# Patient Record
Sex: Male | Born: 1970 | Race: White | Hispanic: No | Marital: Married | Smoking: Never smoker
Health system: Southern US, Community
[De-identification: ages and names within clinical notes are randomized; demographics above are authoritative.]

## PROBLEM LIST (undated history)

## (undated) DIAGNOSIS — F329 Major depressive disorder, single episode, unspecified: Secondary | ICD-10-CM

## (undated) DIAGNOSIS — K219 Gastro-esophageal reflux disease without esophagitis: Secondary | ICD-10-CM

## (undated) DIAGNOSIS — F32A Depression, unspecified: Secondary | ICD-10-CM

## (undated) DIAGNOSIS — I10 Essential (primary) hypertension: Secondary | ICD-10-CM

## (undated) DIAGNOSIS — F419 Anxiety disorder, unspecified: Secondary | ICD-10-CM

## (undated) DIAGNOSIS — I4510 Unspecified right bundle-branch block: Secondary | ICD-10-CM

## (undated) DIAGNOSIS — E785 Hyperlipidemia, unspecified: Secondary | ICD-10-CM

## (undated) DIAGNOSIS — F909 Attention-deficit hyperactivity disorder, unspecified type: Secondary | ICD-10-CM

## (undated) HISTORY — DX: Major depressive disorder, single episode, unspecified: F32.9

## (undated) HISTORY — DX: Essential (primary) hypertension: I10

## (undated) HISTORY — DX: Anxiety disorder, unspecified: F41.9

## (undated) HISTORY — DX: Hyperlipidemia, unspecified: E78.5

## (undated) HISTORY — DX: Depression, unspecified: F32.A

## (undated) HISTORY — DX: Gastro-esophageal reflux disease without esophagitis: K21.9

## (undated) HISTORY — DX: Unspecified right bundle-branch block: I45.10

## (undated) HISTORY — DX: Attention-deficit hyperactivity disorder, unspecified type: F90.9

---

## 2008-10-22 HISTORY — PX: ESOPHAGOGASTRODUODENOSCOPY: SHX1529

## 2009-10-22 HISTORY — PX: COLONOSCOPY: SHX174

## 2010-11-25 ENCOUNTER — Emergency Department (HOSPITAL_COMMUNITY)
Admission: EM | Admit: 2010-11-25 | Discharge: 2010-11-25 | Disposition: A | Discharge status: Home / Self Care | Payer: 59 | Attending: Emergency Medicine | Admitting: Emergency Medicine

## 2010-11-25 DIAGNOSIS — I10 Essential (primary) hypertension: Secondary | ICD-10-CM | POA: Insufficient documentation

## 2010-11-25 DIAGNOSIS — E7800 Pure hypercholesterolemia, unspecified: Secondary | ICD-10-CM | POA: Insufficient documentation

## 2010-11-25 DIAGNOSIS — K219 Gastro-esophageal reflux disease without esophagitis: Secondary | ICD-10-CM | POA: Insufficient documentation

## 2010-11-25 DIAGNOSIS — R42 Dizziness and giddiness: Secondary | ICD-10-CM | POA: Insufficient documentation

## 2011-01-03 ENCOUNTER — Encounter: Payer: Self-pay | Admitting: Physician Assistant

## 2011-01-03 ENCOUNTER — Ambulatory Visit (INDEPENDENT_AMBULATORY_CARE_PROVIDER_SITE_OTHER): Payer: 59 | Admitting: Physician Assistant

## 2011-01-03 DIAGNOSIS — E785 Hyperlipidemia, unspecified: Secondary | ICD-10-CM | POA: Insufficient documentation

## 2011-01-03 DIAGNOSIS — F411 Generalized anxiety disorder: Secondary | ICD-10-CM | POA: Insufficient documentation

## 2011-01-03 DIAGNOSIS — R002 Palpitations: Secondary | ICD-10-CM | POA: Insufficient documentation

## 2011-01-03 DIAGNOSIS — I10 Essential (primary) hypertension: Secondary | ICD-10-CM | POA: Insufficient documentation

## 2011-01-03 DIAGNOSIS — R079 Chest pain, unspecified: Secondary | ICD-10-CM | POA: Insufficient documentation

## 2011-01-03 NOTE — Progress Notes (Deleted)
Subjective:      Patient ID: Eddie Cook is a 40 y.o. male.  Chief Complaint: HPI {Common ambulatory SmartLinks:19316} ROS    Objective:    Physical Exam  Lab Review:  {Recent labs:19471::"not applicable"}    Assessment:     No diagnosis found.   Plan:     ***

## 2011-01-03 NOTE — Progress Notes (Unsigned)
HPI:  This is a 40 year old white male patient who is referred to [place] for evaluation of left arm pain. He complains of left arm hurting that comes and goes and has been going on for several months. He notices it more at rest. It is not associated with any chest pain, palpitations, dizziness, or presyncope. He occasionally has a sharp shooting chest pain while at rest but this is fleeting as well. He occasionally notices pounding of his heart but this is not associated with the left arm pain.   He can run 5-7 miles at a time and has no symptoms while he runs. He is under an enormous amount of  stress as a [employer] and claims that he is ADD and has a lot of anxiety issues. He drinks 2-3, 12 ounce diet Cokes daily and feels like he is wound tight.. He denies any chest pressure, heaviness, radiation into his neck back or arm. He did go to an urgent care in [place] month ago for dizziness after a long weekend of meetings and he was told his EKG had a slight right bundle branch block but he knew he had that. He also had a stress Myoview in 2008  in [place] when he presented with chest pain after drinking 3 large energy drinks. He was told he had a right bundle branch block at that time and the stress test was normal.  Allergies not on file    PMHX: hypertension, hyperlipidemia, low testosterone, EDD, GERD, incomplete right bundle branch block,  No past surgical history on file.  No family history on file.  History   Social History  . Marital Status: Divorced    Spouse Name: N/A    Number of Children: N/A  . Years of Education: N/A   Occupational History  . Not on file.   Social History Main Topics  . Smoking status: Not on file  . Smokeless tobacco: Not on file  . Alcohol Use: Not on file  . Drug Use: Not on file  . Sexually Active: Not on file   Other Topics Concern  . Not on file   Social History Narrative  . No narrative on file    ROS{Ros -  complete:30496} The patient denies anorexia, fever, weight loss,, vision loss, decreased hearing, hoarseness, chest pain, syncope, dyspnea on exertion, peripheral edema, balance deficits, hemoptysis, abdominal pain, melena, hematochezia, severe indigestion/heartburn, hematuria, incontinence, genital sores, muscle weakness, suspicious skin lesions, transient blindness, difficulty walking, depression, unusual weight change, abnormal bleeding, enlarged lymph nodes, angioedema, and breast masses.  PHYSICAL EXAM Well-nournished, in no acute distress. Neck: No JVD, HJR, Bruit, or thyroid enlargement Lungs: No tachypnea, clear without wheezing, rales, or rhonchi Cardiovascular: RRR, PMI not displaced, heart sounds normal, no murmurs, gallops, bruit, thrill, or heave. Abdomen: BS normal. Soft without organomegaly, masses, lesions or tenderness. Extremities: without cyanosis, clubbing or edema. Good distal pulses bilateral SKin: Warm, no lesions or rashes  Musculoskeletal: No deformities Neuro: no focal signs  There were no vitals taken for this visit.  EKG:  ASSESSMENT AND PLAN:

## 2011-01-09 NOTE — Assessment & Plan Note (Signed)
Summary: NP CP PER DR WALL/TMJ   Visit Type:  Initial Consult Primary Provider:  Dr.Hawkins   History of Present Illness: This is a 40 year old white male patient who is referred to [place] for evaluation of left arm pain. He complains of left arm hurting that comes and goes and has been going on for several months. He notices it more at rest. It is not associated with any chest pain, palpitations, dizziness, or presyncope. He occasionally has a sharp shooting chest pain while at rest but this is fleeting as well. He occasionally notices pounding of his heart but this is not associated with the left arm pain.   He can run 5-7 miles at a time and has no symptoms while he runs. He is under an enormous amount of  stress as a [place] [employer] and claims that he is ADD and has a lot of anxiety issues. He drinks 2-3, 12 ounce diet Cokes daily and feels like he is wound tight.. He denies any chest pressure, heaviness, radiation into his neck back or arm. He did go to an urgent care in [place] month ago for dizziness after a long weekend of meetings and he was told his EKG had a slight right bundle branch block but he knew he had that. He also had a stress Myoview in 2008  in [place] when he presented with chest pain after drinking 3 large energy drinks. He was told he had a right bundle branch block at that time and the stress test was normal.  Current Medications (verified): 1)  Omeprazole 20 Mg Cpdr (Omeprazole) .... Take 1 Tab Daily 2)  Losartan Potassium-Hctz 50-12.5 Mg Tabs (Losartan Potassium-Hctz) .... Take 1 Tab Daily 3)  Pravachol 40 Mg Tabs (Pravastatin Sodium) .... Take 1 Tab Daily 4)  Pristiq 100 Mg Xr24h-Tab (Desvenlafaxine Succinate) .... Take 1 Tab Daily 5)  Concerta 36 Mg Cr-Tabs (Methylphenidate Hcl) .... Take 1 Tab Daily 6)  Androgel 25 Mg/2.5gm Gel (Testosterone) .... Use As Directed  Allergies (verified): 1)  ! Penicillin  Past History:  Past Medical  History: hypertension, hyperlipidemia, low testosterone, EDD, GERD, incomplete right bundle branch block,  Family History: Reviewed history and no changes required. Father:that of a blood clot in his leg age 79 Mother:73 with hypertension and dementia Siblings:one brother with hypertension one sister alive and well maternal grandfather died of an MI maternal grandmother died of heart disease  Social History: Reviewed history and no changes required. [facility] Divorced with 2 children He occasionally smokes tobaccoless cigarettes or cigars when he golfs but has never been a tobacco smoker Runs 5-7 miles several days a week Drinks 3-4 alcoholic drinks socially per week  Review of Systems       patient has a significant amount of stress and anxiety related to his job,otherwise review of systems are negative  Vital Signs:  Patient profile:   40 year old male Height:      72 inches Weight:      194 pounds BMI:     26.41 Pulse rate:   63 / minute BP sitting:   148 / 88  (left arm)  Vitals Entered By: Dreama Saa, CNA (January 03, 2011 1:24 PM)  Physical Exam  General:   Well-nournished, in no acute distress. Neck: No JVD, HJR, Bruit, or thyroid enlargement Lungs: No tachypnea, clear without wheezing, rales, or rhonchi Cardiovascular: RRR, PMI not displaced, positive S4, no murmurs, bruit, thrill, or heave. Abdomen: BS normal. Soft without organomegaly,  masses, lesions or tenderness. Extremities: without cyanosis, clubbing or edema. Good distal pulses bilateral SKin: Warm, no lesions or rashes  Musculoskeletal: No deformities Neuro: no focal signs    EKG  Procedure date:  01/03/2011  Findings:      normal sinus rhythm with incomplete right bundle branch block  Impression & Recommendations:  Problem # 1:  CHEST PAIN-UNSPECIFIED (ICD-786.50) Patient has history of sharp shooting chest pain that is not associated with his left arm hurting it has been  going off-and-on for several months. His symptoms do not sound cardiac in nature. He can run 5-7 miles without reproducing his symptoms.Dr. Daleen Squibb has offered him reassurance and we will not do any further cardiac workup at this time.  Problem # 2:  PALPITATIONS (ICD-785.1) Patient has occasional pounding in his chest but no associated dizziness or presyncope or fast heart rates. I asked him to decrease his caffeine intake.  Problem # 3:  HYPERTENSION, BENIGN (ICD-401.1) Patient's blood pressure is controlled for the most part. Blood pressures range at home from 118-138/80-85 His updated medication list for this problem includes:    Losartan Potassium-hctz 50-12.5 Mg Tabs (Losartan potassium-hctz) .Marland Kitchen... Take 1 tab daily  Problem # 4:  HYPERLIPIDEMIA-MIXED (ICD-272.4) treated and followed by Dr. Juanetta Gosling His updated medication list for this problem includes:    Pravachol 40 Mg Tabs (Pravastatin sodium) .Marland Kitchen... Take 1 tab daily  Patient Instructions: 1)  Decrease caffeine intake 2)  Your physician recommends that you schedule a follow-up appointment in: as needed  Appended Document: NP CP PER DR WALL/TMJ pt seen and examined. Education provided regarding sxs of CAD and how to respond.

## 2011-10-12 ENCOUNTER — Encounter: Payer: Self-pay | Admitting: Cardiology

## 2012-09-29 ENCOUNTER — Ambulatory Visit (INDEPENDENT_AMBULATORY_CARE_PROVIDER_SITE_OTHER): Payer: BLUE CROSS/BLUE SHIELD | Admitting: Gastroenterology

## 2012-09-29 ENCOUNTER — Encounter: Payer: Self-pay | Admitting: Gastroenterology

## 2012-09-29 VITALS — BP 150/90 | HR 63 | Temp 98.2°F | Ht 72.0 in | Wt 193.4 lb

## 2012-09-29 DIAGNOSIS — R1314 Dysphagia, pharyngoesophageal phase: Secondary | ICD-10-CM

## 2012-09-29 DIAGNOSIS — R1013 Epigastric pain: Secondary | ICD-10-CM | POA: Insufficient documentation

## 2012-09-29 DIAGNOSIS — R1110 Vomiting, unspecified: Secondary | ICD-10-CM | POA: Insufficient documentation

## 2012-09-29 DIAGNOSIS — R1310 Dysphagia, unspecified: Secondary | ICD-10-CM | POA: Insufficient documentation

## 2012-09-29 LAB — COMPREHENSIVE METABOLIC PANEL
Albumin: 4.8 g/dL (ref 3.5–5.2)
Alkaline Phosphatase: 42 U/L (ref 39–117)
BUN: 14 mg/dL (ref 6–23)
CO2: 27 mEq/L (ref 19–32)
Calcium: 9.8 mg/dL (ref 8.4–10.5)
Glucose, Bld: 106 mg/dL — ABNORMAL HIGH (ref 70–99)
Potassium: 4.3 mEq/L (ref 3.5–5.3)

## 2012-09-29 LAB — CBC WITH DIFFERENTIAL/PLATELET
Basophils Relative: 1 % (ref 0–1)
HCT: 41.9 % (ref 39.0–52.0)
Hemoglobin: 14.5 g/dL (ref 13.0–17.0)
Lymphs Abs: 1.6 10*3/uL (ref 0.7–4.0)
MCH: 31.5 pg (ref 26.0–34.0)
MCHC: 34.6 g/dL (ref 30.0–36.0)
Monocytes Absolute: 0.5 10*3/uL (ref 0.1–1.0)
Monocytes Relative: 8 % (ref 3–12)
Neutro Abs: 3.7 10*3/uL (ref 1.7–7.7)
RBC: 4.61 MIL/uL (ref 4.22–5.81)

## 2012-09-29 LAB — LIPASE: Lipase: 22 U/L (ref 0–75)

## 2012-09-29 NOTE — Assessment & Plan Note (Signed)
Several week h/o epigastric pain associated with intermittent N/V, esophageal dysphagia to solid foods. Patient admits to couple of aspirin powders per week at times. Describes EGD for food impaction a couple of years ago. Report requested. He is unsure if esophagus stretched at that time. He reports waking up during EGD but quickly given more sedation. Discussed conscious sedation with augmentation with phenergan vs propofol. Patient prefers conscious sedation.  EGD/ED in near future. I have discussed the risks, alternatives, benefits with regards to but not limited to the risk of reaction to medication, bleeding, infection, perforation and the patient is agreeable to proceed. Written consent to be obtained.  Labs ordered.

## 2012-09-29 NOTE — Progress Notes (Signed)
Primary Care Physician:  Fredirick Maudlin, MD  Primary Gastroenterologist:  Roetta Sessions, MD   Chief Complaint  Patient presents with  . Abdominal Pain    HPI:  BRYLAN DEC is a 41 y.o. male here for further evaluation of 3-4 week h/o epigastric pain associated with intermittent N/V. States he has been under a lot of stress with family and work. Mother passed away recently with lung cancer/dementia. Over the past three-four weeks he has had increasing pain in LUQ/epigastric pain. He feels weak. Cannot eat well due to N/V. Prilosec helps heartburn. Lots of heartburn if misses dose. Difficulty swallowing, especially meats. In 2011, steak became lodged requiring emergent EGD. He is not sure if esophageus was stretched. Denies constipation, diarrhea, melena, brbpr, weight loss.     Current Outpatient Prescriptions  Medication Sig Dispense Refill  . escitalopram (LEXAPRO) 20 MG tablet Take 10 mg by mouth daily.      Marland Kitchen losartan-hydrochlorothiazide (HYZAAR) 50-12.5 MG per tablet Take 1 tablet by mouth daily.        Marland Kitchen omeprazole (PRILOSEC) 20 MG capsule Take 20 mg by mouth daily.        . pravastatin (PRAVACHOL) 40 MG tablet Take 40 mg by mouth daily.        . Testosterone (ANDROGEL) 25 MG/2.5GM GEL Place onto the skin as directed.          Allergies as of 09/29/2012 - Review Complete 09/29/2012  Allergen Reaction Noted  . Penicillins  01/03/2011    Past Medical History  Diagnosis Date  . Hypertension   . Hyperlipidemia   . Low testosterone   . GERD (gastroesophageal reflux disease)   . Incomplete RBBB   . Depression   . Anxiety     Past Surgical History  Procedure Date  . Colonoscopy     [facility] [facility] about year ago  . Esophagogastroduodenoscopy 2011    foreign body per patient    Family History  Problem Relation Age of Onset  . Hypertension Mother   . Dementia Mother     deceased, cancer-adenocarcinoma lung  . Clotting disorder Father 63    Blood clot in leg  .  Hypertension Brother   . Heart disease Maternal Grandmother   . Heart attack Maternal Grandfather   . Liver disease Neg Hx   . Colon cancer Neg Hx     History   Social History  . Marital Status: Divorced    Spouse Name: N/A    Number of Children: 2  . Years of Education: N/A   Occupational History  . 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 Smoking status: Never Smoker   . Smokeless tobacco: Not on file     Comment: Occasionally smokes tobaccoless cigarettes or cigars when golfing, but never tobacco  . Alcohol Use: 2.0 oz/week    4 drink(s) per week     Comment: Socially, couple of beers on the weekend  . Drug Use: No  . Sexually Active: Not on file   Other Topics Concern  . Not on file   Social History Narrative   Divorced, 2 childrenRuns 5-7 miles several days a week      ROS:  General: Negative for anorexia, weight loss, fever, chills, fatigue, weakness. Eyes: Negative for vision changes.  ENT: Negative for hoarseness, nasal congestion. CV: Negative for chest pain, angina, palpitations, dyspnea on exertion, peripheral edema.  Respiratory: Negative for dyspnea at rest, dyspnea on  exertion, cough, sputum, wheezing.  GI: See history of present illness. GU:  Negative for dysuria, hematuria, urinary incontinence, urinary frequency, nocturnal urination.  MS: Negative for joint pain, low back pain.  Derm: Negative for rash or itching.  Neuro: Negative for weakness, abnormal sensation, seizure, frequent headaches, memory loss, confusion.  Psych: Negative for anxiety, depression, suicidal ideation, hallucinations.  Endo: Negative for unusual weight change.  Heme: Negative for bruising or bleeding. Allergy: Negative for rash or hives.    Physical Examination:  BP 150/90  Pulse 63  Temp 98.2 F (36.8 C) (Oral)  Ht 6' (1.829 m)  Wt 193 lb 6.4 oz (87.726 kg)  BMI 26.23 kg/m2   General: Well-nourished, well-developed in no acute  distress.  Head: Normocephalic, atraumatic.   Eyes: Conjunctiva pink, no icterus. Mouth: Oropharyngeal mucosa moist and pink , no lesions erythema or exudate. Neck: Supple without thyromegaly, masses, or lymphadenopathy.  Lungs: Clear to auscultation bilaterally.  Heart: Regular rate and rhythm, no murmurs rubs or gallops.  Abdomen: Bowel sounds are normal, mild epigastric tenderness, nondistended, no hepatosplenomegaly or masses, no abdominal bruits or    hernia , no rebound or guarding.   Rectal: not performed Extremities: No lower extremity edema. No clubbing or deformities.  Neuro: Alert and oriented x 4 , grossly normal neurologically.  Skin: Warm and dry, no rash or jaundice.   Psych: Alert and cooperative, normal mood and affect.

## 2012-09-29 NOTE — Patient Instructions (Signed)
We have scheduled you for an upper endoscopy with Dr. Jena Gauss. Please see separate instructions.  Please have your lab work done.

## 2012-09-29 NOTE — Progress Notes (Signed)
Quick Note:  Labs ok.  EGD as planned. ______

## 2012-09-30 ENCOUNTER — Encounter (HOSPITAL_COMMUNITY): Payer: Self-pay | Admitting: *Deleted

## 2012-09-30 ENCOUNTER — Encounter (HOSPITAL_COMMUNITY)
Admission: RE | Disposition: A | Discharge status: Home / Self Care | Payer: Self-pay | Source: Ambulatory Visit | Attending: Internal Medicine

## 2012-09-30 ENCOUNTER — Encounter: Payer: Self-pay | Admitting: Gastroenterology

## 2012-09-30 ENCOUNTER — Ambulatory Visit (HOSPITAL_COMMUNITY)
Admission: RE | Admit: 2012-09-30 | Discharge: 2012-09-30 | Disposition: A | Discharge status: Home / Self Care | Payer: BC Managed Care – PPO | Source: Ambulatory Visit | Attending: Internal Medicine | Admitting: Internal Medicine

## 2012-09-30 DIAGNOSIS — R1110 Vomiting, unspecified: Secondary | ICD-10-CM

## 2012-09-30 DIAGNOSIS — K2940 Chronic atrophic gastritis without bleeding: Secondary | ICD-10-CM | POA: Insufficient documentation

## 2012-09-30 DIAGNOSIS — R1310 Dysphagia, unspecified: Secondary | ICD-10-CM | POA: Insufficient documentation

## 2012-09-30 DIAGNOSIS — K222 Esophageal obstruction: Secondary | ICD-10-CM

## 2012-09-30 DIAGNOSIS — K2970 Gastritis, unspecified, without bleeding: Secondary | ICD-10-CM

## 2012-09-30 DIAGNOSIS — R1013 Epigastric pain: Secondary | ICD-10-CM

## 2012-09-30 DIAGNOSIS — K2990 Gastroduodenitis, unspecified, without bleeding: Secondary | ICD-10-CM

## 2012-09-30 DIAGNOSIS — K449 Diaphragmatic hernia without obstruction or gangrene: Secondary | ICD-10-CM | POA: Insufficient documentation

## 2012-09-30 DIAGNOSIS — K210 Gastro-esophageal reflux disease with esophagitis: Secondary | ICD-10-CM

## 2012-09-30 DIAGNOSIS — I10 Essential (primary) hypertension: Secondary | ICD-10-CM | POA: Insufficient documentation

## 2012-09-30 HISTORY — PX: ESOPHAGOGASTRODUODENOSCOPY (EGD) WITH ESOPHAGEAL DILATION: SHX5812

## 2012-09-30 SURGERY — ESOPHAGOGASTRODUODENOSCOPY (EGD) WITH ESOPHAGEAL DILATION
Anesthesia: Moderate Sedation

## 2012-09-30 MED ORDER — MEPERIDINE HCL 100 MG/ML IJ SOLN
INTRAMUSCULAR | Status: DC | PRN
  Administered 2012-09-30: 25 mg via INTRAVENOUS
  Administered 2012-09-30: 50 mg via INTRAVENOUS
  Administered 2012-09-30: 25 mg via INTRAVENOUS

## 2012-09-30 MED ORDER — PROMETHAZINE HCL 25 MG/ML IJ SOLN
25.0000 mg | Freq: Once | INTRAMUSCULAR | Status: AC
  Administered 2012-09-30: 25 mg via INTRAVENOUS

## 2012-09-30 MED ORDER — MEPERIDINE HCL 100 MG/ML IJ SOLN
INTRAMUSCULAR | Status: AC
  Filled 2012-09-30: qty 1

## 2012-09-30 MED ORDER — STERILE WATER FOR IRRIGATION IR SOLN
Status: DC | PRN
  Administered 2012-09-30: 08:00:00

## 2012-09-30 MED ORDER — SODIUM CHLORIDE 0.45 % IV SOLN
INTRAVENOUS | Status: DC
  Administered 2012-09-30: 07:00:00 via INTRAVENOUS

## 2012-09-30 MED ORDER — SODIUM CHLORIDE 0.9 % IJ SOLN
INTRAMUSCULAR | Status: AC
  Filled 2012-09-30: qty 10

## 2012-09-30 MED ORDER — MIDAZOLAM HCL 5 MG/5ML IJ SOLN
INTRAMUSCULAR | Status: AC
  Filled 2012-09-30: qty 10

## 2012-09-30 MED ORDER — PROMETHAZINE HCL 25 MG/ML IJ SOLN
INTRAMUSCULAR | Status: AC
  Filled 2012-09-30: qty 1

## 2012-09-30 MED ORDER — MIDAZOLAM HCL 5 MG/5ML IJ SOLN
INTRAMUSCULAR | Status: DC | PRN
  Administered 2012-09-30: 2 mg via INTRAVENOUS
  Administered 2012-09-30 (×4): 1 mg via INTRAVENOUS

## 2012-09-30 MED ORDER — BUTAMBEN-TETRACAINE-BENZOCAINE 2-2-14 % EX AERO
INHALATION_SPRAY | CUTANEOUS | Status: DC | PRN
  Administered 2012-09-30: 2 via TOPICAL

## 2012-09-30 NOTE — Progress Notes (Signed)
Quick Note:  Pt aware ______ 

## 2012-09-30 NOTE — Interval H&P Note (Signed)
History and Physical Interval Note:  09/30/2012 7:31 AM  Eddie Cook  has presented today for surgery, with the diagnosis of Epigastric Pain, Nausea & Vomiting, Dysphagia  The various methods of treatment have been discussed with the patient and family. After consideration of risks, benefits and other options for treatment, the patient has consented to  Procedure(s) (LRB) with comments: ESOPHAGOGASTRODUODENOSCOPY (EGD) WITH ESOPHAGEAL DILATION (N/A) - 7:30 as a surgical intervention .  The patient's history has been reviewed, patient examined, no change in status, stable for surgery.  I have reviewed the patient's chart and labs.  Questions were answered to the patient's satisfaction.     Eddie Cook  As above. EGD, etc. per plan.The risks, benefits, limitations, alternatives and imponderables have been reviewed with the patient. Potential for esophageal dilation, biopsy, etc. have also been reviewed.  Questions have been answered. All parties agreeable.

## 2012-09-30 NOTE — Progress Notes (Signed)
Records from [facility] reviewed.   EGD reportedly done in 2010 for food impaction. Report not available to me at this time.  Colonoscopy in 10/2009 with propofol was negative. Repeat TCS in 10/2019.

## 2012-09-30 NOTE — Progress Notes (Signed)
Faxed to PCP

## 2012-09-30 NOTE — Op Note (Signed)
[facility] [address] [place], [address]   ENDOSCOPY PROCEDURE REPORT  PATIENT: Eddie Cook, Eddie Cook  MR#: [id number] BIRTHDATE: 22-Nov-1970 , 41  yrs. old GENDER: Male ENDOSCOPIST: R.  Roetta Sessions, MD FACP FACG REFERRED BY:  Kari Baars, M.D. PROCEDURE DATE:  09/30/2012 PROCEDURE:     EGD with Elease Hashimoto dilation followed by esophageal and gastric biopsy  INDICATIONS:      Epigastric pain; esophageal dysphagia  INFORMED CONSENT:   The risks, benefits, limitations, alternatives and imponderables have been discussed.  The potential for biopsy, esophogeal dilation, etc. have also been reviewed.  Questions have been answered.  All parties agreeable.  Please see the history and physical in the medical record for more information.  MEDICATIONS:      Versed 6 mg IV and Demerol 100 mg IV in divided doses. Phenergan 25 mg IV. Cetacaine spray.  DESCRIPTION OF PROCEDURE:   The EG-[id number] ([id number])  endoscope was introduced through the mouth and advanced to the second portion of the duodenum without difficulty or limitations.  The mucosal surfaces were surveyed very carefully during advancement of the scope and upon withdrawal.  Retroflexion view of the proximal stomach and esophagogastric junction was performed.      FINDINGS:   Accentuating undulating Z line versus short segment Barrett's esophagus.Schatzki's ring. Circumferential  erosions straddling the GE junction with areas of small ulceration. No appearance consistent with eosinophilic esophagitis. More proximal esophagus appeared widely patent. Stomach empty. Small hiatal hernia. Multiple antral erosions. No ulcer or infiltrating process. Pylorus patent. Normal first and second portion of the duodenum.  THERAPEUTIC / DIAGNOSTIC MANEUVERS PERFORMED:  A 56 French Maloney dilator was passed to full insertion easily. A look back revealed the ring had been nicely dilated with minimal bleeding and  without apparent complication. Subsequently, biopsies of the abnormal GE junction taken. Finally, biopsies the abnormal gastric antrum taken.   COMPLICATIONS:  None  IMPRESSION:  Schatzki's ring -- dilated as described above. Erosive and ulcerative reflux esophagitis - Status post biopsy of the GE junction.   Hiatal hernia.  Antral erosions-status post biopsy  RECOMMENDATIONS: Omeprazole inadequate for his condition. Begin Dexilant 60 mg daily. Patient is to go by my office for free samples. Followup on pathology. Office visit with me in 2 months    _______________________________ R. Roetta Sessions, MD FACP [facility] eSigned:  R. Roetta Sessions, MD FACP [facility] 09/30/2012 8:26 AM     CC:  PATIENT NAME:  Braedyn, Kauk MR#: [id number]

## 2012-09-30 NOTE — H&P (View-Only) (Signed)
Primary Care Physician:  HAWKINS,EDWARD L, MD  Primary Gastroenterologist:  Michael Rourk, MD   Chief Complaint  Patient presents with  . Abdominal Pain    HPI:  Eddie Cook is a 41 y.o. male here for further evaluation of 3-4 week h/o epigastric pain associated with intermittent N/V. States he has been under a lot of stress with family and work. Mother passed away recently with lung cancer/dementia. Over the past three-four weeks he has had increasing pain in LUQ/epigastric pain. He feels weak. Cannot eat well due to N/V. Prilosec helps heartburn. Lots of heartburn if misses dose. Difficulty swallowing, especially meats. In 2011, steak became lodged requiring emergent EGD. He is not sure if esophageus was stretched. Denies constipation, diarrhea, melena, brbpr, weight loss.     Current Outpatient Prescriptions  Medication Sig Dispense Refill  . escitalopram (LEXAPRO) 20 MG tablet Take 10 mg by mouth daily.      . losartan-hydrochlorothiazide (HYZAAR) 50-12.5 MG per tablet Take 1 tablet by mouth daily.        . omeprazole (PRILOSEC) 20 MG capsule Take 20 mg by mouth daily.        . pravastatin (PRAVACHOL) 40 MG tablet Take 40 mg by mouth daily.        . Testosterone (ANDROGEL) 25 MG/2.5GM GEL Place onto the skin as directed.          Allergies as of 09/29/2012 - Review Complete 09/29/2012  Allergen Reaction Noted  . Penicillins  01/03/2011    Past Medical History  Diagnosis Date  . Hypertension   . Hyperlipidemia   . Low testosterone   . GERD (gastroesophageal reflux disease)   . Incomplete RBBB   . Depression   . Anxiety     Past Surgical History  Procedure Date  . Colonoscopy     [facility] about year ago  . Esophagogastroduodenoscopy 2011    foreign body per patient    Family History  Problem Relation Age of Onset  . Hypertension Mother   . Dementia Mother     deceased, cancer-adenocarcinoma lung  . Clotting disorder Father 77    Blood clot in leg  .  Hypertension Brother   . Heart disease Maternal Grandmother   . Heart attack Maternal Grandfather   . Liver disease Neg Hx   . Colon cancer Neg Hx     History   Social History  . Marital Status: Divorced    Spouse Name: N/A    Number of Children: 2  . Years of Education: N/A   Occupational History  . 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 Smoking status: Never Smoker   . Smokeless tobacco: Not on file     Comment: Occasionally smokes tobaccoless cigarettes or cigars when golfing, but never tobacco  . Alcohol Use: 2.0 oz/week    4 drink(s) per week     Comment: Socially, couple of beers on the weekend  . Drug Use: No  . Sexually Active: Not on file   Other Topics Concern  . Not on file   Social History Narrative   Divorced, 2 childrenRuns 5-7 miles several days a week      ROS:  General: Negative for anorexia, weight loss, fever, chills, fatigue, weakness. Eyes: Negative for vision changes.  ENT: Negative for hoarseness, nasal congestion. CV: Negative for chest pain, angina, palpitations, dyspnea on exertion, peripheral edema.  Respiratory: Negative for dyspnea at rest, dyspnea on   exertion, cough, sputum, wheezing.  GI: See history of present illness. GU:  Negative for dysuria, hematuria, urinary incontinence, urinary frequency, nocturnal urination.  MS: Negative for joint pain, low back pain.  Derm: Negative for rash or itching.  Neuro: Negative for weakness, abnormal sensation, seizure, frequent headaches, memory loss, confusion.  Psych: Negative for anxiety, depression, suicidal ideation, hallucinations.  Endo: Negative for unusual weight change.  Heme: Negative for bruising or bleeding. Allergy: Negative for rash or hives.    Physical Examination:  BP 150/90  Pulse 63  Temp 98.2 F (36.8 C) (Oral)  Ht 6' (1.829 m)  Wt 193 lb 6.4 oz (87.726 kg)  BMI 26.23 kg/m2   General: Well-nourished, well-developed in no acute  distress.  Head: Normocephalic, atraumatic.   Eyes: Conjunctiva pink, no icterus. Mouth: Oropharyngeal mucosa moist and pink , no lesions erythema or exudate. Neck: Supple without thyromegaly, masses, or lymphadenopathy.  Lungs: Clear to auscultation bilaterally.  Heart: Regular rate and rhythm, no murmurs rubs or gallops.  Abdomen: Bowel sounds are normal, mild epigastric tenderness, nondistended, no hepatosplenomegaly or masses, no abdominal bruits or    hernia , no rebound or guarding.   Rectal: not performed Extremities: No lower extremity edema. No clubbing or deformities.  Neuro: Alert and oriented x 4 , grossly normal neurologically.  Skin: Warm and dry, no rash or jaundice.   Psych: Alert and cooperative, normal mood and affect.   

## 2012-10-02 ENCOUNTER — Encounter (HOSPITAL_COMMUNITY): Payer: Self-pay | Admitting: Internal Medicine

## 2012-10-03 ENCOUNTER — Telehealth: Payer: Self-pay

## 2012-10-03 NOTE — Telephone Encounter (Signed)
Pt's wife is calling because he is having sever cramping in his abd area. He is hurts that he is unable to sleep. He is want to know what he can do about the pain or if there is something we can call in for him. The new reflex medication is helping with the reflex but the pain is in his abd area. Please advise.

## 2012-10-03 NOTE — Telephone Encounter (Signed)
I Returned  call [phone]; got voicemail. Left a message for return call to the office.

## 2012-10-05 ENCOUNTER — Encounter: Payer: Self-pay | Admitting: Internal Medicine

## 2012-10-05 NOTE — Telephone Encounter (Signed)
I called patient back on the afternoon of December 13. Left a message. He called me back through the hospital operator. Patient concerned  He's  still having epigastric pain for which he came to see me for. Just started Dexilant. Biopsies negative for Barrett's esophagus. No evidence of infection or neoplasm on gastric biopsy. I recommend he continue Dexilant; I called in a prescription for Carafate suspension 1 g 4 times a day x5 days-I called into [facility]. He is to call us on December 15 and let us know how he is doing-one way or the other and we will go from there. He is to take the Dexilant daily.

## 2012-10-06 ENCOUNTER — Encounter: Payer: Self-pay | Admitting: *Deleted

## 2012-10-08 ENCOUNTER — Ambulatory Visit (INDEPENDENT_AMBULATORY_CARE_PROVIDER_SITE_OTHER): Payer: BC Managed Care – PPO | Admitting: Gastroenterology

## 2012-10-08 ENCOUNTER — Encounter: Payer: Self-pay | Admitting: Gastroenterology

## 2012-10-08 VITALS — BP 143/87 | HR 63 | Temp 99.2°F | Ht 72.0 in | Wt 196.0 lb

## 2012-10-08 DIAGNOSIS — R1314 Dysphagia, pharyngoesophageal phase: Secondary | ICD-10-CM

## 2012-10-08 DIAGNOSIS — R109 Unspecified abdominal pain: Secondary | ICD-10-CM

## 2012-10-08 DIAGNOSIS — R1310 Dysphagia, unspecified: Secondary | ICD-10-CM

## 2012-10-08 NOTE — Progress Notes (Signed)
Referring Provider: Fredirick Maudlin, MD Primary Care Physician:  Fredirick Maudlin, MD Primary GI: Dr. Jena Gauss   Chief Complaint  Patient presents with  . lump under skin on abdomen    HPI:   41 year old recently seen by our office actually just 9 days ago. EGD subsequently performed with severe erosive and ulcerative esophagitis. Path with mild chronic gastritis, negative H.pylori. Started on Dexilant. Dilation performed secondary to Schatzki's ring. Presents today with concerns regarding a knot in RLQ, hurts to press on it. Epigastric pain much improved with Dexilant daily. No dysphagia.  Diarrhea started with Carafate. Intermittent. No blood in stool.    Past Medical History  Diagnosis Date  . Hypertension   . Hyperlipidemia   . Low testosterone   . GERD (gastroesophageal reflux disease)   . Incomplete RBBB   . Depression   . Anxiety     Past Surgical History  Procedure Date  . Colonoscopy 10/2009    [facility]. Propofol. Negative. Next TCS 10/2019.  [facility] Kitchen Esophagogastroduodenoscopy 2010    foreign body per patient  . Esophagogastroduodenoscopy (egd) with esophageal dilation 09/30/2012    RMR: Schatzki's ring s/p dilation with 56-F, erosive and lucerative reflux esophagitis, antral erosions,  path negative for H.pylori, esophageal biopsy with mild inflammation c/w GERD, no Barretts    Current Outpatient Prescriptions  Medication Sig Dispense Refill  . dexlansoprazole (DEXILANT) 60 MG capsule Take 60 mg by mouth daily.      Marland Kitchen escitalopram (LEXAPRO) 20 MG tablet Take 10 mg by mouth daily.      Marland Kitchen losartan-hydrochlorothiazide (HYZAAR) 50-12.5 MG per tablet Take 1 tablet by mouth daily.        . pravastatin (PRAVACHOL) 40 MG tablet Take 40 mg by mouth daily.        . Testosterone (ANDROGEL) 25 MG/2.5GM GEL Place onto the skin as directed.          Allergies as of 10/08/2012 - Review Complete 10/08/2012  Allergen Reaction Noted  . Penicillins  01/03/2011    Family History   Problem Relation Age of Onset  . Hypertension Mother   . Dementia Mother     deceased, cancer-adenocarcinoma lung  . Clotting disorder Father 90    Blood clot in leg  . Hypertension Brother   . Heart disease Maternal Grandmother   . Heart attack Maternal Grandfather   . Liver disease Neg Hx   . Colon cancer Neg Hx     History   Social History  . Marital Status: Divorced    Spouse Name: N/A    Number of Children: 2  . Years of Education: N/A   Occupational History  . COUNTY MANAGER     [place]   Social History Main Topics  . Smoking status: Never Smoker   . Smokeless tobacco: None     Comment: Occasionally smokes tobaccoless cigarettes or cigars when golfing, but never tobacco  . Alcohol Use: 2.0 oz/week    4 drink(s) per week     Comment: Socially, couple of beers on the weekend  . Drug Use: No  . Sexually Active: None   Other Topics Concern  . None   Social History Narrative   Divorced, 2 childrenRuns 5-7 miles several days a week    Review of Systems: Negative unless otherwise mentioned in HPI.   Physical Exam: BP 143/87  Pulse 63  Temp 99.2 F (37.3 C) (Oral)  Ht 6' (1.829 m)  Wt 196 lb (88.905 kg)  BMI 26.58 kg/m2  General:   Alert and oriented. No distress noted. Pleasant and cooperative.  Head:  Normocephalic and atraumatic. Eyes:  Conjuctiva clear without scleral icterus. Mouth:  Oral mucosa pink and moist. Good dentition. No lesions. Heart:  S1, S2 present without murmurs, rubs, or gallops. Regular rate and rhythm. Abdomen:  +BS, soft, non-tender and non-distended. No rebound or guarding. No HSM or masses noted. I have been unable to appreciate any mass or abnormality. +Carnett's Sign. Msk:  Symmetrical without gross deformities. Normal posture. Extremities:  Without edema. Neurologic:  Alert and  oriented x4;  grossly normal neurologically. Skin:  Intact without significant lesions or rashes. Psych:  Alert and cooperative. Normal mood  and affect.

## 2012-10-08 NOTE — Patient Instructions (Addendum)
Continue taking Dexilant daily.   We will see you back in 4 weeks or sooner as necessary.   Please call me if you notice any worsening of your pain or any changes.     [employer]!!

## 2012-10-10 ENCOUNTER — Telehealth: Payer: Self-pay

## 2012-10-10 DIAGNOSIS — R109 Unspecified abdominal pain: Secondary | ICD-10-CM | POA: Insufficient documentation

## 2012-10-10 NOTE — Assessment & Plan Note (Addendum)
Notes a "knot" in RLQ. However, I am unable to appreciate this at all on exam. Pt notes restarting his routine running program, and the discomfort commenced after this. +Carnett's sign. I strongly suspect a musculoskeletal etiology at this point. He has no fever, chills, change in bowel habits, rectal bleeding. No concerning signs. I have asked him to use tylenol prn, cut back on the strenuous running, avoid straining. If further issues, contact me, and we could potentially schedule a CT. For now, he is doing well.   Return in 4 weeks or sooner if necessary.

## 2012-10-10 NOTE — Telephone Encounter (Signed)
pts wife called- (left voicemail)- pt has been up all night vomiting. He is still vomiting this morning and having abd pain. Pt was seen by [facility] on 10/08/12. He wants to know if we can send in rx for phenergan? Or maybe something for pain? Please advise.

## 2012-10-10 NOTE — Telephone Encounter (Signed)
Spoke with pts wife. Their daughter had a "stomach bug" with vomiting a few days ago. They stated this came on suddenly just like the daughters did, no fever. They are requesting rx for the vomiting. Pt uses [facility]/[place].

## 2012-10-10 NOTE — Telephone Encounter (Signed)
pts wife is aware and rx was called into pharmacy.

## 2012-10-10 NOTE — Progress Notes (Signed)
Faxed to PCP

## 2012-10-10 NOTE — Telephone Encounter (Signed)
Patient may have Norovirus or similar viral infection.  Since nausea and vomiting, by mouth anti-medics would not be necessarily very efficacious ;  let's prescribe Phenergan 25 mg suppositories one per rectum every 6 hours when necessary for nausea-dispense #6 suppositories with no refills.  If worsening of abdominal pain, particularly  right lower quadrant, patient should go to the emergency department without delay.  If concerns about abdominal mass persists, he will need CT scanning at some point.

## 2012-10-10 NOTE — Assessment & Plan Note (Signed)
Resolved. Epigastric discomfort improved with Dexilant.

## 2012-10-10 NOTE — Telephone Encounter (Signed)
I saw him due to his concerns for a possible "bump" or "knot" in RLQ. He had started back running several miles the day before, and this is when he noted this discomfort. I was unable to appreciate any mass, "bump/knot" on exam. +Carnett's sign. I asked him to cut back on his strenuous activity. At that time, there was no need for any imaging, as he was doing well and no physical exam abnormalities.   He was much improved from a reflux/dyspepsia standpoint at that time as well. Can we find out what he has eaten, around any sick contacts, if he is still taking his reflux medication as prescribed? Where is abdominal pain located?   I will be out of the office for around an hour. Routing this to Dr. Jena Gauss in my absence. Will be back around noontime, if it sounds like this can potentially be triaged at that point.

## 2012-10-17 ENCOUNTER — Other Ambulatory Visit: Payer: Self-pay | Admitting: Internal Medicine

## 2012-10-17 NOTE — Telephone Encounter (Signed)
Patient is going out of town wont be back until Jan 6th to be seen and he only has enough Dexilant until Sunday can he get some to last until he can get back into the office to be seen

## 2012-10-20 ENCOUNTER — Telehealth: Payer: Self-pay

## 2012-10-20 NOTE — Telephone Encounter (Signed)
Agree 

## 2012-10-20 NOTE — Telephone Encounter (Signed)
LATE ENTRY:   Pt called Fri for samples of Dexilant tl they can get a prescription sent to [facility] . #4 boxes given.

## 2012-11-03 ENCOUNTER — Encounter: Payer: Self-pay | Admitting: Gastroenterology

## 2012-11-03 ENCOUNTER — Ambulatory Visit: Payer: BC Managed Care – PPO | Admitting: Urgent Care

## 2012-11-03 ENCOUNTER — Ambulatory Visit (INDEPENDENT_AMBULATORY_CARE_PROVIDER_SITE_OTHER): Payer: BC Managed Care – PPO | Admitting: Gastroenterology

## 2012-11-03 VITALS — BP 139/81 | HR 61 | Temp 98.2°F | Ht 72.0 in | Wt 195.4 lb

## 2012-11-03 DIAGNOSIS — R109 Unspecified abdominal pain: Secondary | ICD-10-CM

## 2012-11-03 DIAGNOSIS — R1310 Dysphagia, unspecified: Secondary | ICD-10-CM

## 2012-11-03 DIAGNOSIS — Z9889 Other specified postprocedural states: Secondary | ICD-10-CM

## 2012-11-03 DIAGNOSIS — R1013 Epigastric pain: Secondary | ICD-10-CM

## 2012-11-03 DIAGNOSIS — R1314 Dysphagia, pharyngoesophageal phase: Secondary | ICD-10-CM

## 2012-11-03 MED ORDER — DEXLANSOPRAZOLE 60 MG PO CPDR: 60.0000 mg | DELAYED_RELEASE_CAPSULE | Freq: Every day | ORAL | Status: DC

## 2012-11-03 NOTE — Assessment & Plan Note (Signed)
Resolved. Likely secondary to muscle strain after running. If further issues, CT abd/pelvis.

## 2012-11-03 NOTE — Progress Notes (Signed)
Faxed to PCP

## 2012-11-03 NOTE — Assessment & Plan Note (Signed)
Resolved. Continue Dexilant daily. Refills provided.  Return in 2 years or sooner if needed.

## 2012-11-03 NOTE — Patient Instructions (Addendum)
Continue taking Dexilant daily.  We will see you in 2 years or sooner if needed.

## 2012-11-03 NOTE — Assessment & Plan Note (Signed)
Resolved. Secondary to mild chronic gastritis, negative H.pylori. Continue Dexilant, return in 2 years or sooner if necessary.

## 2012-11-03 NOTE — Assessment & Plan Note (Signed)
Routine screening in 2021.

## 2012-11-03 NOTE — Progress Notes (Signed)
Referring Provider: Fredirick Maudlin, MD Primary Care Physician:  Fredirick Maudlin, MD PRIMARY GI: Dr. Jena Gauss   Chief Complaint  Patient presents with  . Follow-up    HPI:   42 year old pleasant male who returns today in f/u for GERD and vague symptoms of a "knot" in RLQ. However, this was unable to be appreciated on physical exam a few weeks ago. +Carnett's sign at that time. Working diagnosis was possible muscle strain, as he had recently started back his running routine. Hx of mild chronic gastritis, neg H.pylori, Schatzki's ring s/p dilation on EGD in Dec 2013.  Had GI bug in interim. Doing well on Dexilant.  No dysphagia.   Past Medical History  Diagnosis Date  . Hypertension   . Hyperlipidemia   . Low testosterone   . GERD (gastroesophageal reflux disease)   . Incomplete RBBB   . Depression   . Anxiety     Past Surgical History  Procedure Date  . Colonoscopy 10/2009    [facility]. Propofol. Negative. Next TCS 10/2019.  Marland Kitchen Esophagogastroduodenoscopy 2010    foreign body per patient  . Esophagogastroduodenoscopy (egd) with esophageal dilation 09/30/2012    RMR: Schatzki's ring s/p dilation with 56-F, erosive and lucerative reflux esophagitis, antral erosions,  path negative for H.pylori, esophageal biopsy with mild inflammation c/w GERD, no Barretts    Current Outpatient Prescriptions  Medication Sig Dispense Refill  . dexlansoprazole (DEXILANT) 60 MG capsule Take 60 mg by mouth daily.      Marland Kitchen escitalopram (LEXAPRO) 20 MG tablet Take 10 mg by mouth daily.      Marland Kitchen losartan-hydrochlorothiazide (HYZAAR) 50-12.5 MG per tablet Take 1 tablet by mouth daily.        . pravastatin (PRAVACHOL) 40 MG tablet Take 40 mg by mouth daily.        . Testosterone (ANDROGEL) 25 MG/2.5GM GEL Place onto the skin as directed.          Allergies as of 11/03/2012 - Review Complete 11/03/2012  Allergen Reaction Noted  . Penicillins  01/03/2011    Family History  Problem Relation Age of Onset  .  Hypertension Mother   . Dementia Mother     deceased, cancer-adenocarcinoma lung  . Clotting disorder Father 71    Blood clot in leg  . Hypertension Brother   . Heart disease Maternal Grandmother   . Heart attack Maternal Grandfather   . Liver disease Neg Hx   . Colon cancer Neg Hx     History   Social History  . Marital Status: Divorced    Spouse Name: N/A    Number of Children: 2  . Years of Education: N/A   Occupational History  . COUNTY MANAGER     [place]   Social History Main Topics  . Smoking status: Never Smoker   . Smokeless tobacco: None     Comment: Occasionally smokes tobaccoless cigarettes or cigars when golfing, but never tobacco  . Alcohol Use: 2.0 oz/week    4 drink(s) per week     Comment: Socially, couple of beers on the weekend  . Drug Use: No  . Sexually Active: None   Other Topics Concern  . None   Social History Narrative   Divorced, 2 childrenRuns 5-7 miles several days a week    Review of Systems: Negative unless otherwise mentioned in HPI.   Physical Exam: BP 139/81  Pulse 61  Temp 98.2 F (36.8 C) (Oral)  Ht 6' (1.829 m)  Wt 195  lb 6.4 oz (88.633 kg)  BMI 26.50 kg/m2 General:   Alert and oriented. No distress noted. Pleasant and cooperative.  Head:  Normocephalic and atraumatic. Eyes:  Conjuctiva clear without scleral icterus. Mouth:  Oral mucosa pink and moist. Good dentition. No lesions. Heart:  S1, S2 present without murmurs, rubs, or gallops. Regular rate and rhythm. Abdomen:  +BS, soft, non-tender and non-distended. No rebound or guarding. No HSM or masses noted. Msk:  Symmetrical without gross deformities. Normal posture. Extremities:  Without edema. Neurologic:  Alert and  oriented x4;  grossly normal neurologically. Psych:  Alert and cooperative. Normal mood and affect.

## 2013-02-18 ENCOUNTER — Other Ambulatory Visit (HOSPITAL_COMMUNITY): Payer: Self-pay | Admitting: Pulmonary Disease

## 2013-02-18 ENCOUNTER — Ambulatory Visit (HOSPITAL_COMMUNITY)
Admission: RE | Admit: 2013-02-18 | Discharge: 2013-02-18 | Disposition: A | Discharge status: Home / Self Care | Payer: BC Managed Care – PPO | Source: Ambulatory Visit | Attending: Pulmonary Disease | Admitting: Pulmonary Disease

## 2013-02-18 DIAGNOSIS — R109 Unspecified abdominal pain: Secondary | ICD-10-CM

## 2013-02-18 DIAGNOSIS — R197 Diarrhea, unspecified: Secondary | ICD-10-CM | POA: Insufficient documentation

## 2013-02-18 DIAGNOSIS — R110 Nausea: Secondary | ICD-10-CM

## 2013-02-18 MED ORDER — IOHEXOL 300 MG/ML  SOLN
100.0000 mL | Freq: Once | INTRAMUSCULAR | Status: AC | PRN
  Administered 2013-02-18: 100 mL via INTRAVENOUS

## 2013-02-23 ENCOUNTER — Encounter: Payer: Self-pay | Admitting: Gastroenterology

## 2013-02-23 ENCOUNTER — Ambulatory Visit (INDEPENDENT_AMBULATORY_CARE_PROVIDER_SITE_OTHER): Payer: BC Managed Care – PPO | Admitting: Gastroenterology

## 2013-02-23 VITALS — BP 134/79 | HR 56 | Temp 97.2°F | Ht 72.0 in | Wt 197.2 lb

## 2013-02-23 DIAGNOSIS — R1110 Vomiting, unspecified: Secondary | ICD-10-CM

## 2013-02-23 DIAGNOSIS — R1032 Left lower quadrant pain: Secondary | ICD-10-CM | POA: Insufficient documentation

## 2013-02-23 DIAGNOSIS — R197 Diarrhea, unspecified: Secondary | ICD-10-CM

## 2013-02-23 DIAGNOSIS — R1031 Right lower quadrant pain: Secondary | ICD-10-CM

## 2013-02-23 NOTE — Progress Notes (Signed)
Primary Care Physician: Fredirick Maudlin, MD  Primary Gastroenterologist:  Roetta Sessions, MD   Chief Complaint  Patient presents with  . Abdominal Pain  . Emesis  . Diarrhea    HPI: Eddie Cook is a 42 y.o. male here for same day appointment. He called in with complaints of abdominal pain, N/V/D. Patient states he had been doing well since we saw him last in 10/2012. Ten days ago, he developed acute onset N/V/D. PP vomiting daily. Having numerous stools per day. No melena,brbpr. Saw Dr. Juanetta Gosling. CT A/P was OK. Prescribed Flagyl. Vomiting within 15 minutes of a meal although this is better over the last 2 days. C/O fatigue. C/o constant lower abdominal pain. Diarrhea has improved on Flagyl. Now only having couple of loose stools per day. Has 4 days left on Flagyl. Denies ill contacts, recent antibiotics, travel.  Ran 5 miles over the weekend.   Past Surgical History  Procedure Laterality Date  . Colonoscopy  10/2009    [facility]. Propofol. Negative. Next TCS 10/2019.  Mar[facility] Kitchen Esophagogastroduodenoscopy  2010    foreign body per patient  . Esophagogastroduodenoscopy (egd) with esophageal dilation  09/30/2012    RMR: Schatzki's ring s/p dilation with 56-F, erosive and ulcerative reflux esophagitis, antral erosions,  path negative for H.pylori, esophageal biopsy with mild inflammation c/w GERD, no Barretts    Current Outpatient Prescriptions  Medication Sig Dispense Refill  . dexlansoprazole (DEXILANT) 60 MG capsule Take 1 capsule (60 mg total) by mouth daily.  30 capsule  11  . escitalopram (LEXAPRO) 20 MG tablet Take 10 mg by mouth daily.      Marland Kitchen losartan-hydrochlorothiazide (HYZAAR) 50-12.5 MG per tablet Take 1 tablet by mouth daily.        . pravastatin (PRAVACHOL) 40 MG tablet Take 40 mg by mouth daily.        . Testosterone (ANDROGEL) 25 MG/2.5GM GEL Place onto the skin as directed.         No current facility-administered medications for this visit.    Allergies as of 02/23/2013 -  Review Complete 02/23/2013  Allergen Reaction Noted  . Penicillins  01/03/2011    ROS:  General: Negative for anorexia, weight loss, fever, chills,  Weakness. C/o fatigue. ENT: Negative for hoarseness, difficulty swallowing , nasal congestion. CV: Negative for chest pain, angina, palpitations, dyspnea on exertion, peripheral edema.  Respiratory: Negative for dyspnea at rest, dyspnea on exertion, cough, sputum, wheezing.  GI: See history of present illness. GU:  Negative for dysuria, hematuria, urinary incontinence, urinary frequency, nocturnal urination.  Endo: Negative for unusual weight change.    Physical Examination:   BP 134/79  Pulse 56  Temp(Src) 97.2 F (36.2 C) (Oral)  Ht 6' (1.829 m)  Wt 197 lb 3.2 oz (89.449 kg)  BMI 26.74 kg/m2  General: Well-nourished, well-developed in no acute distress.  Eyes: No icterus. Mouth: Oropharyngeal mucosa moist and pink , no lesions erythema or exudate. Lungs: Clear to auscultation bilaterally.  Heart: Regular rate and rhythm, no murmurs rubs or gallops.  Abdomen: Bowel sounds are normal,  nondistended, no hepatosplenomegaly or masses, no abdominal bruits or hernia , no rebound or guarding.  Mild lower abdominal tenderness throughout. Extremities: No lower extremity edema. No clubbing or deformities. Neuro: Alert and oriented x 4   Skin: Warm and dry, no jaundice.   Psych: Alert and cooperative, normal mood and affect.   Imaging Studies: Ct Abdomen Pelvis W Contrast  02/18/2013  *RADIOLOGY REPORT*  Clinical Data: Abdominal pain and  diarrhea.  CT ABDOMEN AND PELVIS WITH CONTRAST  Technique:  Multidetector CT imaging of the abdomen and pelvis was performed following the standard protocol during bolus administration of intravenous contrast.  Contrast: OMNIPAQUE IOHEXOL 300 MG/ML  SOLN  Comparison: None.  Findings: Lung bases show no acute findings.  Heart size within normal limits.  No pericardial or pleural effusion.  Liver,  gallbladder, adrenal glands and kidneys are unremarkable.  A rounded blush of hyper attenuation in the posterior medial spleen measures 1.2 cm and may represent a hemangioma. Spleen, pancreas, stomach and bowel, including appendix, are otherwise unremarkable. No pathologically enlarged lymph nodes.  No free fluid. No worrisome lytic or sclerotic lesions.  IMPRESSION: No acute findings.  No findings to explain the patient's given symptoms.   Original Report Authenticated By: Leanna Battles, M.D.

## 2013-02-23 NOTE — Patient Instructions (Addendum)
1. Please start a probiotic once daily for the next 4 weeks. We did not have any samples today. We have provided you with a coupon for Al[employer] or you can try [employer] colon health. 2. Please call if you continue to have diarrhea and lower abdominal pain. At that point we may offer you stool testing and/or abdominal ultrasound to look at gallbladder based on your symptoms.

## 2013-02-23 NOTE — Assessment & Plan Note (Addendum)
42 y/o male with recent N/V/D, lower abdominal discomfort. No vomiting in two days. Diarrhea improving but still with lower abdominal pain and fatigue. I suspect infectious etiology given he is better after beginning Flagyl. At this point, he will complete his Flagyl. Add probiotic for one month. If he has ongoing diarrhea, abdominal discomfort would offer stool studies. If has ongoing postprandial N/V, consider gallbladder w/u.   He will call if symptoms do not improve or resolve over the next 5-7 days. Call sooner if worsens. Alarm symptoms of fever, hematemesis, melena, brbpr, worsening abdominal pain discussed with patient. If these develop, he will go to nearest ER.

## 2013-02-24 NOTE — Progress Notes (Signed)
Cc PCP 

## 2013-04-21 ENCOUNTER — Emergency Department (HOSPITAL_COMMUNITY): Payer: BC Managed Care – PPO

## 2013-04-21 ENCOUNTER — Encounter (HOSPITAL_COMMUNITY): Payer: Self-pay | Admitting: *Deleted

## 2013-04-21 ENCOUNTER — Emergency Department (HOSPITAL_COMMUNITY)
Admission: EM | Admit: 2013-04-21 | Discharge: 2013-04-21 | Disposition: A | Discharge status: Home / Self Care | Payer: BC Managed Care – PPO | Attending: Emergency Medicine | Admitting: Emergency Medicine

## 2013-04-21 DIAGNOSIS — Z880 Allergy status to penicillin: Secondary | ICD-10-CM | POA: Insufficient documentation

## 2013-04-21 DIAGNOSIS — E785 Hyperlipidemia, unspecified: Secondary | ICD-10-CM | POA: Insufficient documentation

## 2013-04-21 DIAGNOSIS — M519 Unspecified thoracic, thoracolumbar and lumbosacral intervertebral disc disorder: Secondary | ICD-10-CM

## 2013-04-21 DIAGNOSIS — F329 Major depressive disorder, single episode, unspecified: Secondary | ICD-10-CM | POA: Insufficient documentation

## 2013-04-21 DIAGNOSIS — I10 Essential (primary) hypertension: Secondary | ICD-10-CM | POA: Insufficient documentation

## 2013-04-21 DIAGNOSIS — K219 Gastro-esophageal reflux disease without esophagitis: Secondary | ICD-10-CM | POA: Insufficient documentation

## 2013-04-21 DIAGNOSIS — F411 Generalized anxiety disorder: Secondary | ICD-10-CM | POA: Insufficient documentation

## 2013-04-21 DIAGNOSIS — IMO0002 Reserved for concepts with insufficient information to code with codable children: Secondary | ICD-10-CM | POA: Insufficient documentation

## 2013-04-21 DIAGNOSIS — Z79899 Other long term (current) drug therapy: Secondary | ICD-10-CM | POA: Insufficient documentation

## 2013-04-21 DIAGNOSIS — E291 Testicular hypofunction: Secondary | ICD-10-CM | POA: Insufficient documentation

## 2013-04-21 DIAGNOSIS — Z8679 Personal history of other diseases of the circulatory system: Secondary | ICD-10-CM | POA: Insufficient documentation

## 2013-04-21 DIAGNOSIS — F3289 Other specified depressive episodes: Secondary | ICD-10-CM | POA: Insufficient documentation

## 2013-04-21 MED ORDER — OXYCODONE-ACETAMINOPHEN 5-325 MG PO TABS: 1.0000 | ORAL_TABLET | ORAL | Status: DC | PRN

## 2013-04-21 NOTE — ED Notes (Signed)
Pt with lower back pain since Thursday, denies any injury to back, reports with recent massage

## 2013-04-21 NOTE — ED Provider Notes (Signed)
History    CSN: [id number] Arrival date & time 04/21/13  1051  First MD Initiated Contact with Patient 04/21/13 1126     Chief Complaint  Patient presents with  . Back Pain   (Consider location/radiation/quality/duration/timing/severity/associated sxs/prior Treatment) Patient is a 42 y.o. male presenting with back pain. The history is provided by the patient.  Back Pain Location:  Lumbar spine Associated symptoms: no chest pain, no dysuria, no fever and no headaches    NORVILLE DANI is a 42 y.o. male who presents to the ED with low back pain. He went for a massage last week and then the next day noted a little pain and since then has increased. While getting the massage at one point felt a pain in his lower back when she pushed her elbow in to massage. The pain is located in the lower lumbar area. He rated the pain as 10/10. He denies loss of control of bladder or bowels. The pain radiates to the left hip. He saw Dr. Juanetta Gosling yesterday and was started on Prednisone, Skelaxin and hydrocodone. The pain is worse today.  Past Medical History  Diagnosis Date  . Hypertension   . Hyperlipidemia   . Low testosterone   . GERD (gastroesophageal reflux disease)   . Incomplete RBBB   . Depression   . Anxiety    Past Surgical History  Procedure Laterality Date  . Colonoscopy  10/2009    [facility]. Propofol. Negative. Next TCS 10/2019.  Marland Kitchen Esophagogastroduodenoscopy  2010    foreign body per patient  . Esophagogastroduodenoscopy (egd) with esophageal dilation  09/30/2012    RMR: Schatzki's ring s/p dilation with 56-F, erosive and ulcerative reflux esophagitis, antral erosions,  path negative for H.pylori, esophageal biopsy with mild inflammation c/w GERD, no Barretts   Family History  Problem Relation Age of Onset  . Hypertension Mother   . Dementia Mother     deceased, cancer-adenocarcinoma lung  . Clotting disorder Father 63    Blood clot in leg  . Hypertension Brother   . Heart  disease Maternal Grandmother   . Heart attack Maternal Grandfather   . Liver disease Neg Hx   . Colon cancer Neg Hx    History  Substance Use Topics  . Smoking status: Never Smoker   . Smokeless tobacco: Not on file     Comment: Occasionally smokes tobaccoless cigarettes or cigars when golfing, but never tobacco  . Alcohol Use: 2.0 oz/week    4 drink(s) per week     Comment: Socially, couple of beers on the weekend    Review of Systems  Constitutional: Negative for fever.  HENT: Negative for congestion.   Eyes: Negative for pain.  Respiratory: Negative for chest tightness and shortness of breath.   Cardiovascular: Negative for chest pain.  Genitourinary: Negative for dysuria, urgency and frequency.  Musculoskeletal: Positive for back pain.  Skin: Negative for rash.  Neurological: Negative for light-headedness and headaches.  Psychiatric/Behavioral: The patient is not nervous/anxious.     Allergies  Penicillins  Home Medications   Current Outpatient Rx  Name  Route  Sig  Dispense  Refill  . acidophilus (RISAQUAD) CAPS   Oral   Take 1 capsule by mouth daily.         . calcium-vitamin D (OSCAL WITH D) 500-200 MG-UNIT per tablet   Oral   Take 1 tablet by mouth.         . dexlansoprazole (DEXILANT) 60 MG capsule   Oral  Take 1 capsule (60 mg total) by mouth daily.   30 capsule   11   . escitalopram (LEXAPRO) 20 MG tablet   Oral   Take 10 mg by mouth daily.         Marland Kitchen HYDROcodone-acetaminophen (NORCO/VICODIN) 5-325 MG per tablet   Oral   Take 1 tablet by mouth every 6 (six) hours as needed for pain.         Marland Kitchen losartan-hydrochlorothiazide (HYZAAR) 50-12.5 MG per tablet   Oral   Take 1 tablet by mouth daily.           . metaxalone (SKELAXIN) 800 MG tablet   Oral   Take 800 mg by mouth 4 (four) times daily.         Marland Kitchen OVER THE COUNTER MEDICATION   Oral   Take 2 capsules by mouth 2 (two) times daily. HYDROXYCUT         . pravastatin (PRAVACHOL)  40 MG tablet   Oral   Take 40 mg by mouth daily.           . predniSONE (STERAPRED UNI-PAK) 10 MG tablet   Oral   Take 10 mg by mouth daily. 6-5-4-3-2-1         . Testosterone (ANDROGEL) 25 MG/2.5GM GEL   Transdermal   Place onto the skin as directed.            There were no vitals taken for this visit. Physical Exam  Nursing note and vitals reviewed. Constitutional: He is oriented to person, place, and time. He appears well-developed and well-nourished. No distress.  HENT:  Head: Normocephalic and atraumatic.  Eyes: Conjunctivae and EOM are normal. Pupils are equal, round, and reactive to light.  Neck: Normal range of motion. Neck supple.  Cardiovascular: Normal rate, regular rhythm and normal heart sounds.   Pulmonary/Chest: Effort normal and breath sounds normal.  Abdominal: Soft. There is no tenderness.  Musculoskeletal:       Lumbar back: He exhibits decreased range of motion, tenderness and spasm. He exhibits no deformity, no laceration and normal pulse.       Back:  Patient with sever pain with palpation and range of motion. Ambulatory with slow but steady gait without foot drag. Pain over lower lumbar spine and left sciatic nerve with palpation.  Neurological: He is alert and oriented to person, place, and time. He has normal strength and normal reflexes. No cranial nerve deficit or sensory deficit. Coordination and gait normal.  Pedal pulses equal bilateral, adequate circulation, good touch sensation.  Skin: Skin is warm and dry.  Psychiatric: He has a normal mood and affect. His behavior is normal. Judgment and thought content normal.   Mr Lumbar Spine Wo Contrast  04/21/2013   *RADIOLOGY REPORT*  Clinical Data: Low back pain.  Severe left-sided pain.  MRI LUMBAR SPINE WITHOUT CONTRAST  Technique:  Multiplanar and multiecho pulse sequences of the lumbar spine were obtained without intravenous contrast.  Comparison: None.  Findings: Normal signal is present in the  conus medullaris which terminates at L1, within normal limits.  Schmorl's nodes are present.  Vertebral body heights are otherwise normal.  Alignment is anatomic.  There is straightening of the normal lumbar lordosis. Somewhat short pedicles are evident throughout the lumbar spine. The disc levels at L3-4 and above demonstrate no significant disc herniation.  Mild facet hypertrophy is present.  There is no significant stenosis.  L4-5:  The disc signal is preserved.  There is no significant  herniation.  Facet hypertrophy is somewhat more prominent.  This results in minimal lateral recess narrowing on the left.  L5-S1:  A left paracentral disc protrusion contacts and displaces the left S1 nerve root.  Mild left foraminal narrowing is evident as well.  IMPRESSION:  1.  Moderate left lateral recess and mild left foraminal stenosis at L5-S1 secondary to a leftward disc protrusion. 2.  Mild facet hypertrophy at L4-5 with minimal left lateral recess narrowing. 3.  Relatively short pedicles throughout the lumbar spine intruding to the stenosis.   Original Report Authenticated By: Marin Roberts, M.D.    ED Course: discussed with Dr. Juanetta Gosling and he will schedule patient to see Neurosurgeon.   Procedures   MDM  42 y.o. male with low back pain that radiates to the left leg due to left lateral recess and mild left foraminal stenosis of L5-S1.  Discussed with the patient x-ray and clinical findings and all questioned fully answered.   Medication List    STOP taking these medications       HYDROcodone-acetaminophen 5-325 MG per tablet  Commonly known as:  NORCO/VICODIN      TAKE these medications       oxyCODONE-acetaminophen 5-325 MG per tablet  Commonly known as:  PERCOCET/ROXICET  Take 1 tablet by mouth every 4 (four) hours as needed for pain.      ASK your doctor about these medications       acidophilus Caps  Take 1 capsule by mouth daily.     ANDROGEL 25 MG/2.5GM Gel  Generic drug:   Testosterone  Place onto the skin as directed.     calcium-vitamin D 500-200 MG-UNIT per tablet  Commonly known as:  OSCAL WITH D  Take 1 tablet by mouth.     dexlansoprazole 60 MG capsule  Commonly known as:  DEXILANT  Take 1 capsule (60 mg total) by mouth daily.     escitalopram 20 MG tablet  Commonly known as:  LEXAPRO  Take 10 mg by mouth daily.     losartan-hydrochlorothiazide 50-12.5 MG per tablet  Commonly known as:  HYZAAR  Take 1 tablet by mouth daily.     metaxalone 800 MG tablet  Commonly known as:  SKELAXIN  Take 800 mg by mouth 4 (four) times daily.     OVER THE COUNTER MEDICATION  Take 2 capsules by mouth 2 (two) times daily. HYDROXYCUT     pravastatin 40 MG tablet  Commonly known as:  PRAVACHOL  Take 40 mg by mouth daily.     predniSONE 10 MG tablet  Commonly known as:  STERAPRED UNI-PAK  Take 10 mg by mouth daily. 6-5-4-3-2-1         Janne Napoleon, [place] 04/21/13 1717

## 2013-04-21 NOTE — ED Notes (Signed)
Pt c/o lower back pain, cramps that started Thursday, denies any injury, was seen by Dr. Juanetta Gosling yesterday, given prednisone, hydrocodone, prednisone, states that the medication is not working.

## 2013-04-22 NOTE — ED Provider Notes (Signed)
Medical screening examination/treatment/procedure(s) were performed by non-physician practitioner and as supervising physician I was immediately available for consultation/collaboration. Devoria Albe, MD, FACEP   Ward Givens, MD 04/22/13 [phone]

## 2013-08-27 ENCOUNTER — Other Ambulatory Visit: Payer: Self-pay

## 2013-10-22 HISTORY — PX: BACK SURGERY: SHX140

## 2013-11-06 ENCOUNTER — Other Ambulatory Visit: Payer: Self-pay | Admitting: Gastroenterology

## 2014-02-08 ENCOUNTER — Ambulatory Visit (HOSPITAL_COMMUNITY)
Admission: RE | Admit: 2014-02-08 | Discharge: 2014-02-08 | Disposition: A | Discharge status: Home / Self Care | Payer: BC Managed Care – PPO | Source: Ambulatory Visit | Attending: Pulmonary Disease | Admitting: Pulmonary Disease

## 2014-02-08 ENCOUNTER — Other Ambulatory Visit (HOSPITAL_COMMUNITY): Payer: Self-pay | Admitting: Pulmonary Disease

## 2014-02-08 DIAGNOSIS — R109 Unspecified abdominal pain: Secondary | ICD-10-CM

## 2014-09-23 ENCOUNTER — Encounter: Payer: Self-pay | Admitting: Internal Medicine

## 2014-10-13 ENCOUNTER — Other Ambulatory Visit (HOSPITAL_COMMUNITY): Payer: Self-pay | Admitting: Pulmonary Disease

## 2014-10-13 DIAGNOSIS — M79605 Pain in left leg: Secondary | ICD-10-CM

## 2014-10-21 ENCOUNTER — Ambulatory Visit (HOSPITAL_COMMUNITY)
Admission: RE | Admit: 2014-10-21 | Discharge: 2014-10-21 | Disposition: A | Discharge status: Home / Self Care | Payer: BC Managed Care – PPO | Source: Ambulatory Visit | Attending: Pulmonary Disease | Admitting: Pulmonary Disease

## 2014-10-21 DIAGNOSIS — M79605 Pain in left leg: Secondary | ICD-10-CM

## 2014-10-21 DIAGNOSIS — M5127 Other intervertebral disc displacement, lumbosacral region: Secondary | ICD-10-CM | POA: Insufficient documentation

## 2014-10-21 DIAGNOSIS — M5146 Schmorl's nodes, lumbar region: Secondary | ICD-10-CM | POA: Insufficient documentation

## 2014-10-21 DIAGNOSIS — M545 Low back pain: Secondary | ICD-10-CM | POA: Diagnosis present

## 2014-10-21 DIAGNOSIS — M4806 Spinal stenosis, lumbar region: Secondary | ICD-10-CM | POA: Insufficient documentation

## 2014-10-27 ENCOUNTER — Ambulatory Visit: Payer: BC Managed Care – PPO | Admitting: Nurse Practitioner

## 2014-11-04 ENCOUNTER — Encounter: Payer: Self-pay | Admitting: Nurse Practitioner

## 2014-11-04 ENCOUNTER — Ambulatory Visit (INDEPENDENT_AMBULATORY_CARE_PROVIDER_SITE_OTHER): Payer: BLUE CROSS/BLUE SHIELD | Admitting: Nurse Practitioner

## 2014-11-04 ENCOUNTER — Telehealth: Payer: Self-pay

## 2014-11-04 VITALS — BP 142/88 | HR 81 | Temp 97.0°F | Ht 71.0 in | Wt 219.4 lb

## 2014-11-04 DIAGNOSIS — R109 Unspecified abdominal pain: Secondary | ICD-10-CM

## 2014-11-04 DIAGNOSIS — R140 Abdominal distension (gaseous): Secondary | ICD-10-CM

## 2014-11-04 NOTE — Progress Notes (Signed)
Referring Provider: Fredirick Maudlin, MD Primary Care Physician:  Fredirick Maudlin, MD  Primary GI: Dr. Jena Gauss  Chief Complaint  Patient presents with  . Bloated    HPI:   44 year old male presents c/o bloating and abdominal discomfort. GI PMH significant for GERD. Last EGD in 2013 with Schatzki's ring s/p dilation, erosive and ulcerative esophagitis, antral erosions and neg H. Pylori path, no Barretts. Colonoscopy in 2011 normal repeat in 2021.  Current symptoms started about 4 months ago. Also states he put on about 22 pounds in the last year and a half despite no change in his generally healthy diet and regular physical activity including running four days a week. His abdomen feels tight. Also feels his abdomen used to be flat and now it's more distended. States he's been utilizing "cool sculpting" for the past year and wonders if this is contributory. Abdominal fullness and bloating is located in the lower abdomen. Denies increase in belching or flatulence. Typically has a bowel movement once daily and is described a normal, formed, and soft. States about 50 percent of the time he feels like he has not fully emptied his bowels. Denies constipation, diarrhea, N/V. Denies hematochezia and melena. Denies any recurrent dysphagia since dilation in 2013. Denies NSAID use. Occasional ASA powder up to a couple times a week. GERD symptoms well controlled on current PPI regimen of Dexilant  daily. Denies any other upper or lower GI problems.   Past Medical History  Diagnosis Date  . Hypertension   . Hyperlipidemia   . Low testosterone   . GERD (gastroesophageal reflux disease)   . Incomplete RBBB   . Depression   . Anxiety     Past Surgical History  Procedure Laterality Date  . Colonoscopy  10/2009    [facility]. Propofol. Negative. Next TCS 10/2019.  Marland Kitchen Esophagogastroduodenoscopy  2010    foreign body per patient  . Esophagogastroduodenoscopy (egd) with esophageal dilation   09/30/2012    RMR: Schatzki's ring s/p dilation with 56-F, erosive and ulcerative reflux esophagitis, antral erosions,  path negative for H.pylori, esophageal biopsy with mild inflammation c/w GERD, no Barretts    Current Outpatient Prescriptions  Medication Sig Dispense Refill  . calcium-vitamin D (OSCAL WITH D) 500-200 MG-UNIT per tablet Take 1 tablet by mouth.    . DEXILANT 60 MG capsule take 1 capsule by mouth once daily 30 capsule 11  . losartan-hydrochlorothiazide (HYZAAR) 50-12.5 MG per tablet Take 1 tablet by mouth daily.      Marland Kitchen oxyCODONE-acetaminophen (PERCOCET/ROXICET) 5-325 MG per tablet Take 1 tablet by mouth every 4 (four) hours as needed for pain. 20 tablet 0  . pravastatin (PRAVACHOL) 40 MG tablet Take 40 mg by mouth daily.      . Testosterone (ANDROGEL) 25 MG/2.5GM GEL Place onto the skin as directed.      Marland Kitchen acidophilus (RISAQUAD) CAPS Take 1 capsule by mouth daily.    Marland Kitchen escitalopram (LEXAPRO) 20 MG tablet Take 10 mg by mouth daily.    . metaxalone (SKELAXIN) 800 MG tablet Take 800 mg by mouth 4 (four) times daily.    Marland Kitchen OVER THE COUNTER MEDICATION Take 2 capsules by mouth 2 (two) times daily. HYDROXYCUT    . predniSONE (STERAPRED UNI-PAK) 10 MG tablet Take 10 mg by mouth daily. 6-5-4-3-2-1     No current facility-administered medications for this visit.    Allergies as of 11/04/2014 - Review Complete 11/04/2014  Allergen Reaction Noted  . Penicillins Hives 01/03/2011  Family History  Problem Relation Age of Onset  . Hypertension Mother   . Dementia Mother     deceased, cancer-adenocarcinoma lung  . Clotting disorder Father [id number]    Blood clot in leg  . Hypertension Brother   . Heart disease Maternal Grandmother   . Heart attack Maternal Grandfather   . Liver disease Neg Hx   . Colon cancer Neg Hx     History   Social History  . Marital Status: Married    Spouse Name: N/A    Number of Children: 2  . Years of Education: N/A   Occupational History  .  [place]   Social History Main Topics  . Smoking status: Never Smoker   . Smokeless tobacco: None     Comment: Occasionally smokes tobaccoless cigarettes or cigars when golfing, but never tobacco  . Alcohol Use: 2.0 oz/week    4 drink(s) per week     Comment: Socially, couple of beers on the weekend  . Drug Use: No  . Sexual Activity: None   Other Topics Concern  . None   Social History Narrative   Divorced, 2 children   Runs 5-7 miles several days a week    Review of Systems: Gen: Denies fever, chills, anorexia. Denies fatigue, weakness, weight loss.  CV: Denies chest pain, palpitations, syncope, peripheral edema, and claudication. Resp: Denies dyspnea at rest, cough, wheezing, coughing up blood, and pleurisy. GI: Denies vomiting blood, jaundice, and fecal incontinence.   Denies dysphagia or odynophagia. Derm: Denies rash, itching, dry skin Psych: Denies depression, anxiety, memory loss, confusion. Heme: Denies bruising, bleeding, and enlarged lymph nodes.  Physical Exam: BP 142/88 mmHg  Pulse 81  Temp(Src) 97 F (36.1 C) (Oral)  Ht 5\' 11"  (1.803 m)  Wt 219 lb 6.4 oz (99.519 kg)  BMI 30.61 kg/m2 General:   Alert and oriented. No distress noted. Pleasant and cooperative.  Head:  Normocephalic and atraumatic. Eyes:  Conjuctiva clear without scleral icterus. Heart:  S1, S2 present without murmurs, rubs, or gallops. Regular rate and rhythm. Abdomen:  +BS, rounded firm abdomen with some mild discomfort to palpation. No rebound or guarding. No rigidity. No HSM or masses noted. Increased dullness to percussion to left lower abdomen. Pulses:  2+ DP noted bilaterally Extremities:  Without edema. Neurologic:  Alert and  oriented x4;  grossly normal neurologically. Skin:  Intact without significant lesions or rashes. Psych:  Alert and cooperative. Normal mood and affect.    11/04/2014 11:30 AM

## 2014-11-04 NOTE — Assessment & Plan Note (Signed)
Patient with vague symptoms including increased abdominal discomfort, bloating, and distension with 22 pound weight gain despite generally healthy diet and active lifestyle which has not changed. Also with increased dullness to the left abdomen on percussion. Last colonoscopy 2011 normal next due in 2021. EGD with errosive and ulcerative esophagitis generally well controlled on Dexilant 60mg  daily. Denies overt diarrhea or constipation although may have an "every other day" issue of incomplete emptying. Symptoms likely retain stool/stool burden and possibly IBS-C with high stress lifestyle/occupation but cannot rule out more occult process. Will proceed with routine CT abdomen/pelvis with contrast to further evaluate his symptoms. If negative can consider increased fiver, miralax, or an IBS-C trial medication for symtomatic improvement.

## 2014-11-04 NOTE — Assessment & Plan Note (Signed)
Patient with vague symptoms including increased abdominal discomfort, bloating, and distension with 22 pound weight gain despite generally healthy diet and active lifestyle which has not changed. Also with increased dullness to the left abdomen on percussion. Last colonoscopy 2011 normal next due in 2021. EGD with errosive and ulcerative esophagitis generally well controlled on Dexilant 60mg daily. Denies overt diarrhea or constipation although may have an "every other day" issue of incomplete emptying. Symptoms likely retain stool/stool burden and possibly IBS-C with high stress lifestyle/occupation but cannot rule out more occult process. Will proceed with routine CT abdomen/pelvis with contrast to further evaluate his symptoms. If negative can consider increased fiver, miralax, or an IBS-C trial medication for symtomatic improvement. 

## 2014-11-04 NOTE — Patient Instructions (Signed)
1. We have ordered a CT scan of your abdomen for further evaluate your symptoms. 2. Further recommendations to be based on the results of the CT.

## 2014-11-04 NOTE — Telephone Encounter (Signed)
Called pt and LMOM regarding CT scan.  Ct scan is scheduled fo r01/19/2016 @ 1000am.  Pt needs to pick up prep and that is noted on message.    Called [employer] and PA# [id number] Valid from 11/04/2014-12/03/2014

## 2014-11-09 ENCOUNTER — Ambulatory Visit (HOSPITAL_COMMUNITY): Payer: BLUE CROSS/BLUE SHIELD

## 2014-11-09 NOTE — Progress Notes (Signed)
cc'ed to pcp °

## 2014-11-14 ENCOUNTER — Other Ambulatory Visit: Payer: Self-pay | Admitting: Gastroenterology

## 2014-11-16 ENCOUNTER — Ambulatory Visit (HOSPITAL_COMMUNITY)
Admission: RE | Admit: 2014-11-16 | Discharge: 2014-11-16 | Disposition: A | Discharge status: Home / Self Care | Payer: BLUE CROSS/BLUE SHIELD | Source: Ambulatory Visit | Attending: Nurse Practitioner | Admitting: Nurse Practitioner

## 2014-11-16 ENCOUNTER — Encounter (HOSPITAL_COMMUNITY): Payer: Self-pay

## 2014-11-16 DIAGNOSIS — R140 Abdominal distension (gaseous): Secondary | ICD-10-CM | POA: Insufficient documentation

## 2014-11-16 DIAGNOSIS — R109 Unspecified abdominal pain: Secondary | ICD-10-CM

## 2014-11-16 DIAGNOSIS — K5730 Diverticulosis of large intestine without perforation or abscess without bleeding: Secondary | ICD-10-CM | POA: Diagnosis not present

## 2014-11-16 DIAGNOSIS — K760 Fatty (change of) liver, not elsewhere classified: Secondary | ICD-10-CM | POA: Diagnosis not present

## 2014-11-16 MED ORDER — IOHEXOL 300 MG/ML  SOLN
100.0000 mL | Freq: Once | INTRAMUSCULAR | Status: AC | PRN
Start: 2014-11-16 — End: 2014-11-16
  Administered 2014-11-16: 100 mL via INTRAVENOUS

## 2014-11-22 ENCOUNTER — Telehealth: Payer: Self-pay | Admitting: Internal Medicine

## 2014-11-22 NOTE — Telephone Encounter (Signed)
Pt called again just when you left for lunch. I told him you would be back in an hour. Please call him back.

## 2014-11-22 NOTE — Telephone Encounter (Signed)
Pt called asking to speak with JL. I told him she was not available at the moment and I could transfer his call to her VM.

## 2014-11-22 NOTE — Telephone Encounter (Signed)
I tried to call him back but only got his voicemail.

## 2014-11-22 NOTE — Telephone Encounter (Signed)
Tried to call pt- LM on voicemail.

## 2014-11-23 NOTE — Telephone Encounter (Signed)
I have spoken with the pt. See ct results note.

## 2015-08-25 IMAGING — CT CT ABD-PELV W/ CM
2 of 5 series · 16 of 46 positions shown, 18 images · IV contrast (Omnipaque 300)
Comparison: 02/18/2013

CLINICAL DATA: Mid abdominal pain and bloating with distension.

EXAM:
CT ABDOMEN AND PELVIS WITH CONTRAST
TECHNIQUE: Multidetector CT imaging of the abdomen and pelvis was performed
using the standard protocol following bolus administration of
intravenous contrast.
CONTRAST:  100mL OMNIPAQUE IOHEXOL 300 MG/ML  SOLN

[Series 2: abd_pel_with 5.0 b40f · axial · 0.78mm/px · z∈[-462,+8]mm · 13 of 106 slices shown, 15 images]
[im 6/106  soft-tissue]
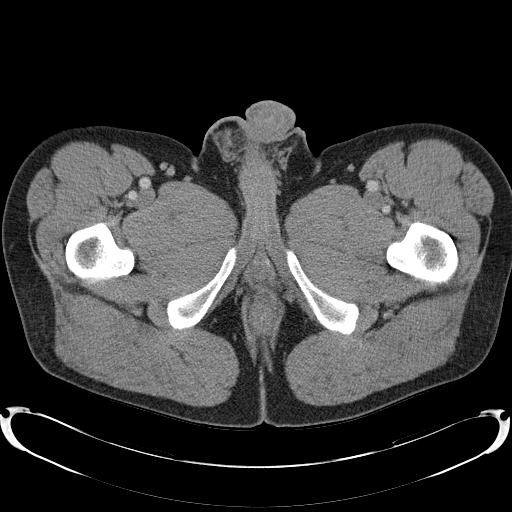
[im 6/106  bone]
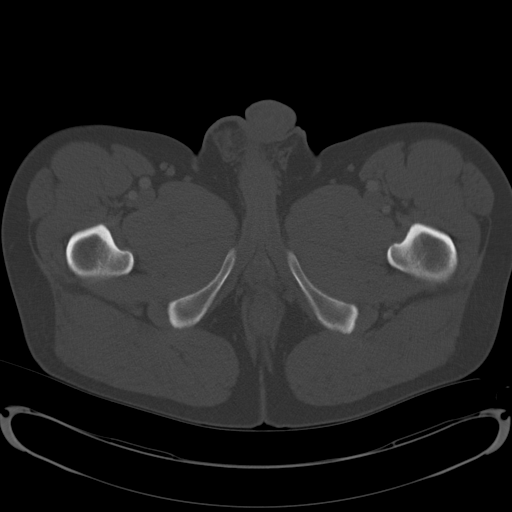
[im 17/106  soft-tissue]
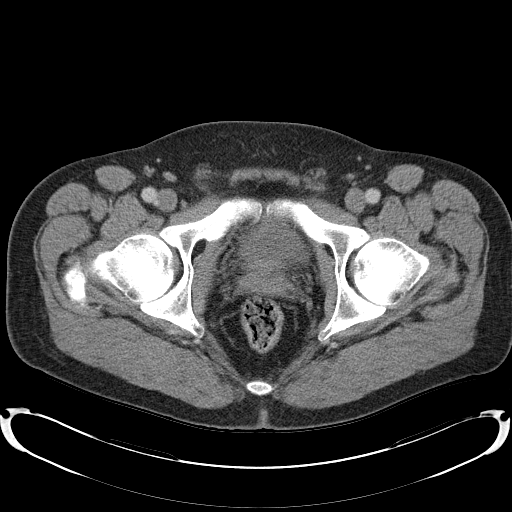
[im 23/106  soft-tissue]
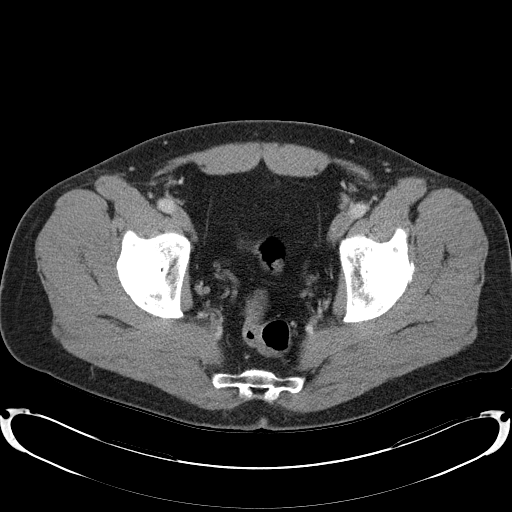
[im 28/106  soft-tissue]
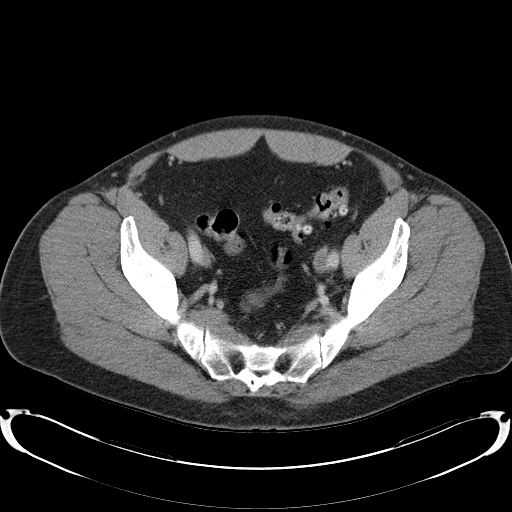
[im 39/106  soft-tissue]
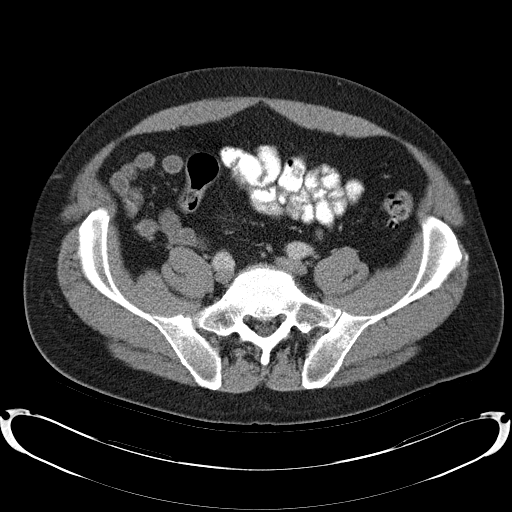
[im 45/106  soft-tissue]
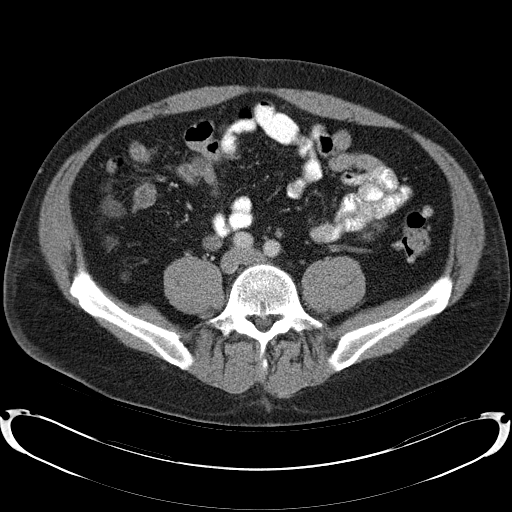
[im 56/106  soft-tissue]
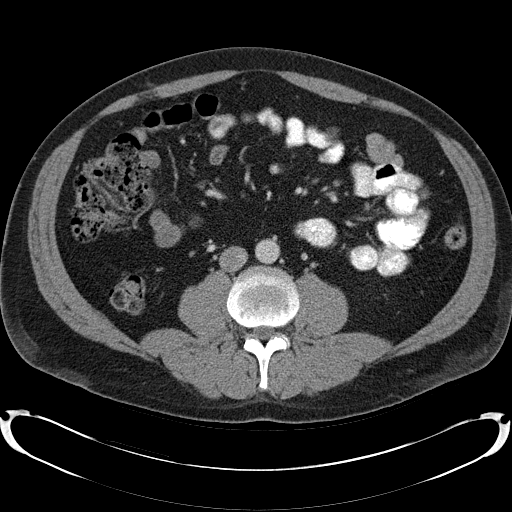
[im 61/106  soft-tissue]
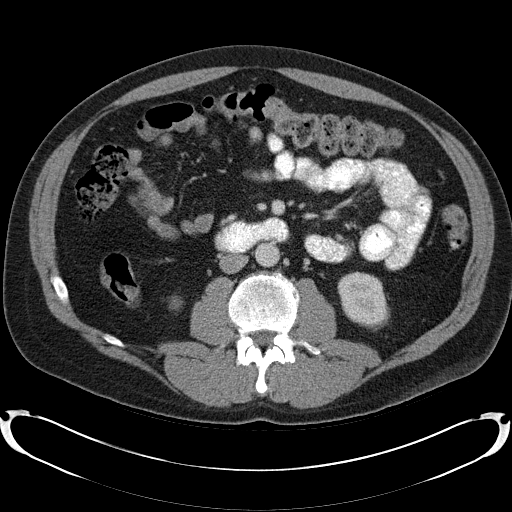
[im 67/106  soft-tissue]
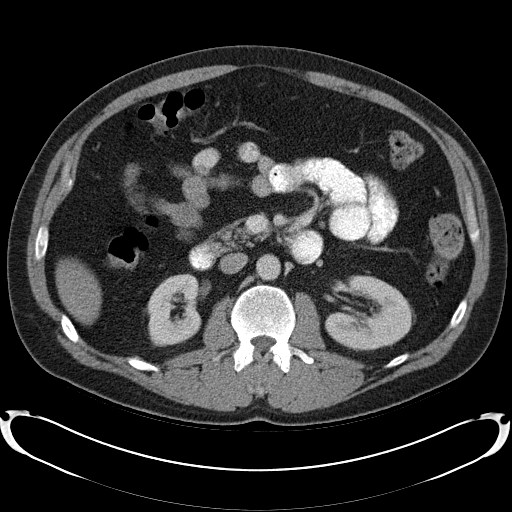
[im 67/106  bone]
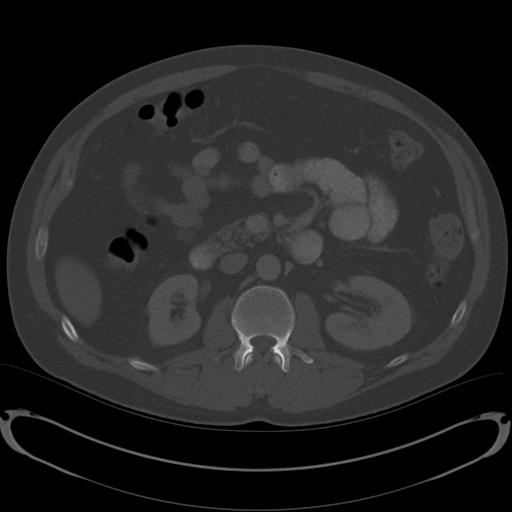
[im 78/106  soft-tissue]
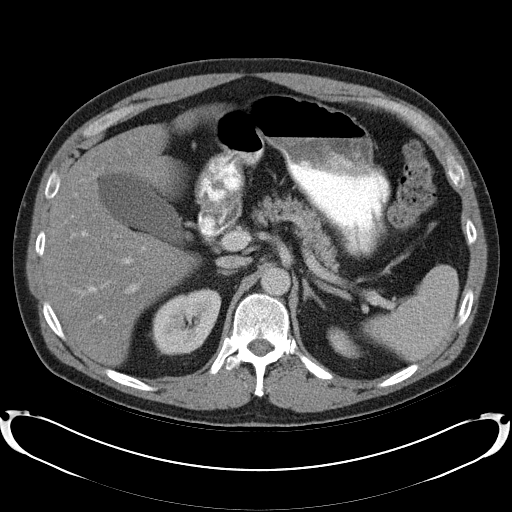
[im 83/106  soft-tissue]
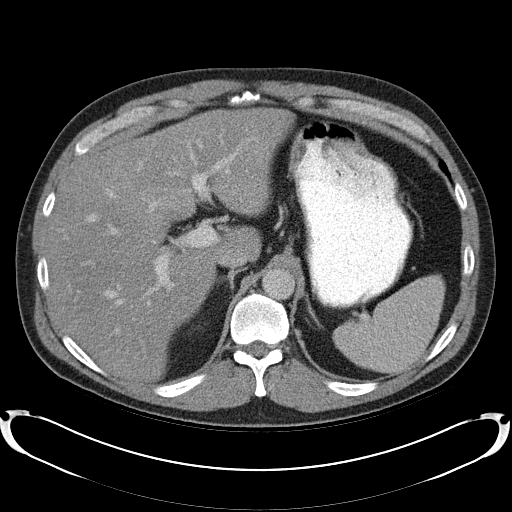
[im 89/106  soft-tissue]
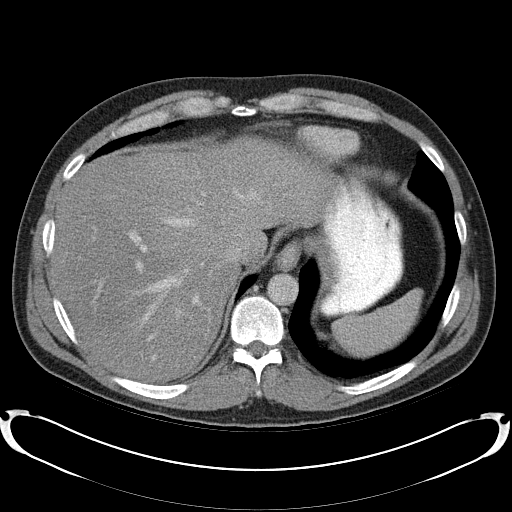
[im 100/106  soft-tissue]
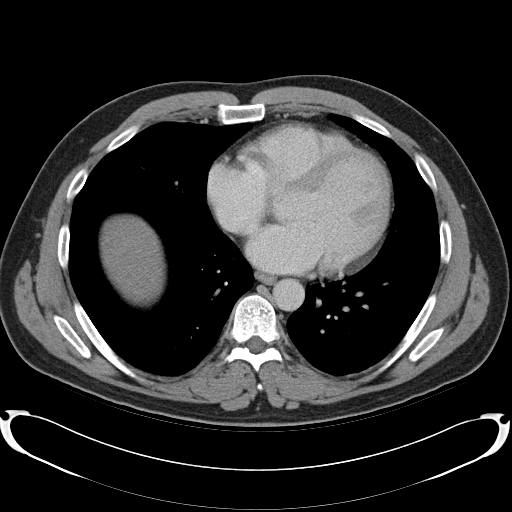

[Series 5: abd_pel_with 3.0 spo · coronal · 0.82mm/px · 3 of 97 slices shown]
[im 33/97  soft-tissue]
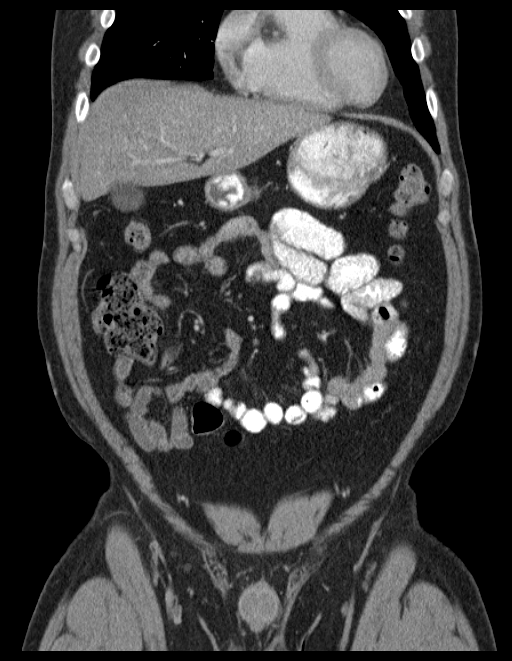
[im 43/97  soft-tissue]
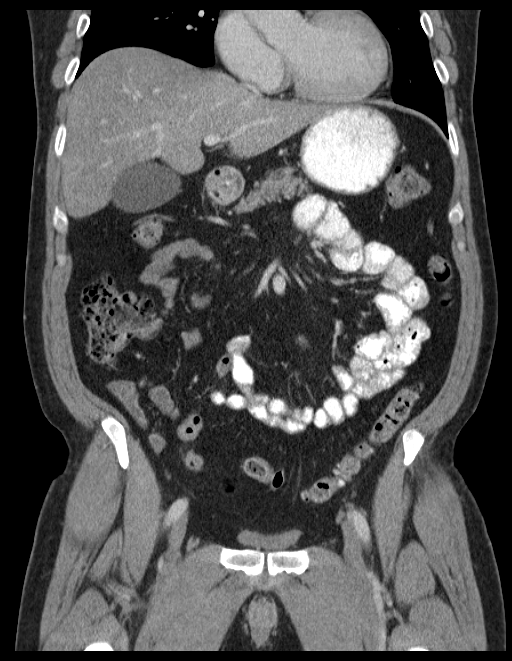
[im 54/97  soft-tissue]
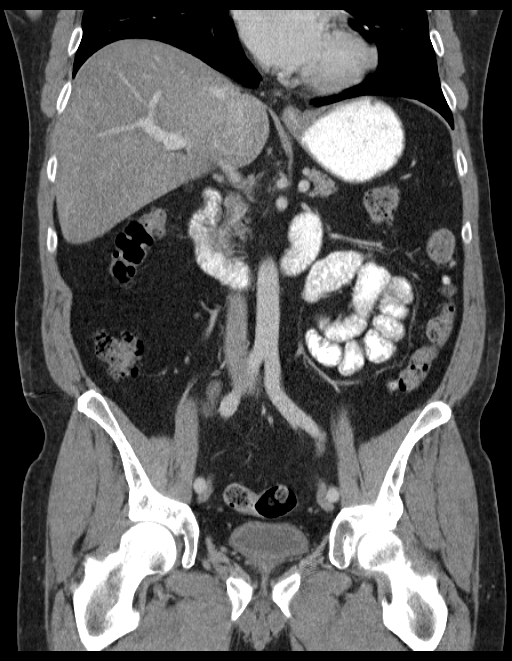

[16 of 46 positions shown; findings below may reference images not displayed]

FINDINGS: Lung bases are normal.

Abdominal images demonstrate mild diffuse hepatic steatosis. The
spleen, pancreas, gallbladder and adrenal glands are within normal.
Kidneys are normal in size, shape and position without
hydronephrosis or nephrolithiasis. Ureters are within normal. The
appendix is normal. There is mild diverticulosis of the colon
predominately involving the descending and sigmoid colon. There is
no active inflammation. Abdominal aorta is normal. Mesenteric is
unremarkable. There is no free fluid or inflammatory change.

Pelvic images demonstrate the bladder, prostate and rectum to be
within normal. There are minimal degenerative changes of the spine.
IMPRESSION: No acute findings in the abdomen/pelvis.

Mild diffuse hepatic steatosis.

Diverticulosis of the colon.

## 2015-11-05 ENCOUNTER — Other Ambulatory Visit: Payer: Self-pay | Admitting: Gastroenterology

## 2016-01-20 ENCOUNTER — Other Ambulatory Visit (HOSPITAL_COMMUNITY): Payer: Self-pay | Admitting: Pulmonary Disease

## 2016-01-20 ENCOUNTER — Ambulatory Visit (HOSPITAL_COMMUNITY)
Admission: RE | Admit: 2016-01-20 | Discharge: 2016-01-20 | Disposition: A | Discharge status: Home / Self Care | Payer: BLUE CROSS/BLUE SHIELD | Source: Ambulatory Visit | Attending: Pulmonary Disease | Admitting: Pulmonary Disease

## 2016-01-20 DIAGNOSIS — R05 Cough: Secondary | ICD-10-CM | POA: Diagnosis not present

## 2016-01-20 DIAGNOSIS — R059 Cough, unspecified: Secondary | ICD-10-CM

## 2016-10-28 IMAGING — DX DG CHEST 2V
2 series · 2 of 2 positions shown · non-contrast
Comparison: 02/08/2014

CLINICAL DATA: PRODUCTIVE COUGH, SHORTNESS OF BREATH, ANTERIOR
CHEST PAIN

EXAM:
CHEST  2 VIEW

[chest pa]
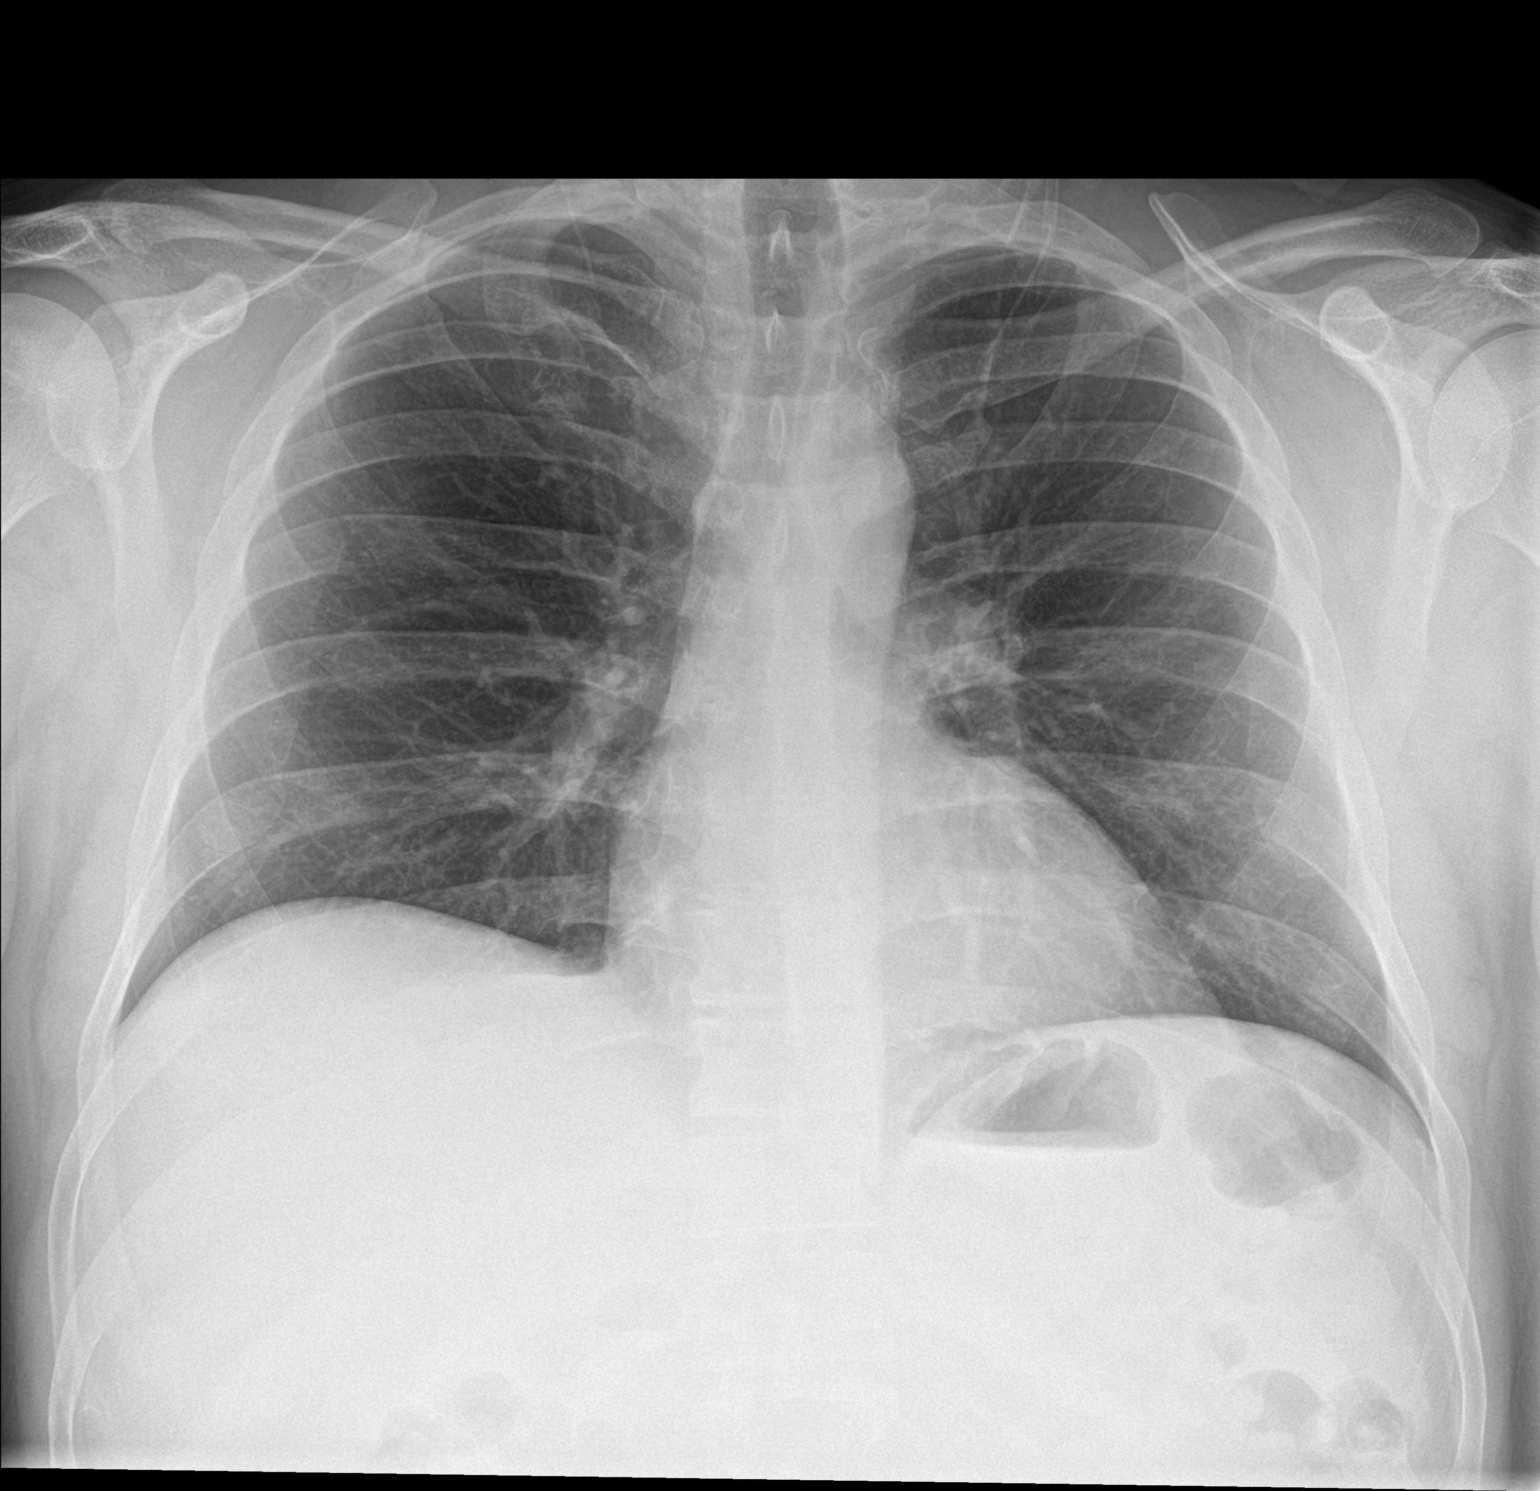

[chest lat]
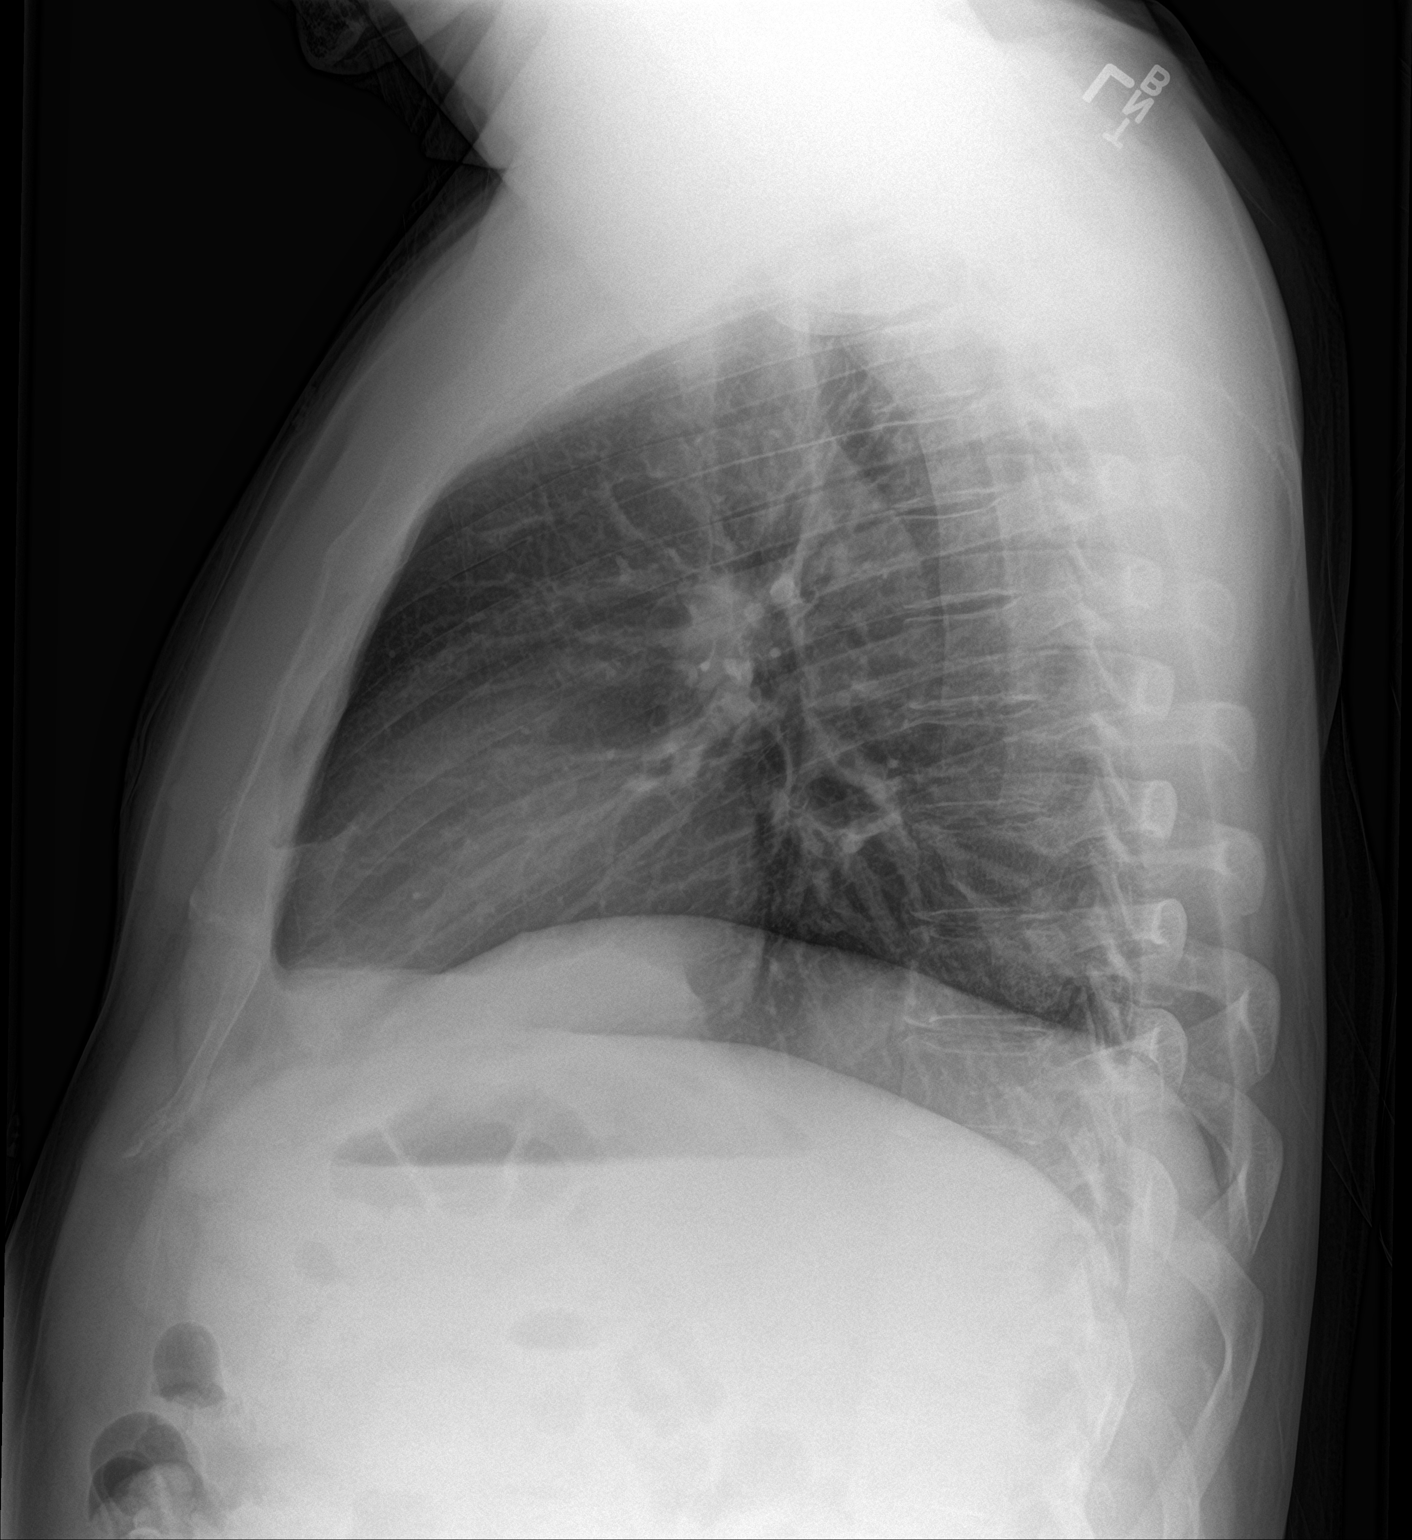

[2 of 2 positions shown; findings below may reference images not displayed]

FINDINGS: The heart size and mediastinal contours are within normal limits.
Both lungs are clear. The visualized skeletal structures are
unremarkable.
IMPRESSION: No active cardiopulmonary disease.

## 2016-11-03 ENCOUNTER — Other Ambulatory Visit: Payer: Self-pay | Admitting: Gastroenterology

## 2016-12-24 ENCOUNTER — Encounter: Payer: Self-pay | Admitting: Cardiology

## 2016-12-24 ENCOUNTER — Ambulatory Visit (INDEPENDENT_AMBULATORY_CARE_PROVIDER_SITE_OTHER): Payer: BLUE CROSS/BLUE SHIELD | Admitting: Cardiology

## 2016-12-24 VITALS — BP 116/70 | HR 67 | Ht 72.0 in | Wt 213.0 lb

## 2016-12-24 DIAGNOSIS — R5383 Other fatigue: Secondary | ICD-10-CM | POA: Diagnosis not present

## 2016-12-24 DIAGNOSIS — R9431 Abnormal electrocardiogram [ECG] [EKG]: Secondary | ICD-10-CM

## 2016-12-24 DIAGNOSIS — I10 Essential (primary) hypertension: Secondary | ICD-10-CM

## 2016-12-24 DIAGNOSIS — E349 Endocrine disorder, unspecified: Secondary | ICD-10-CM

## 2016-12-24 DIAGNOSIS — R7989 Other specified abnormal findings of blood chemistry: Secondary | ICD-10-CM

## 2016-12-24 DIAGNOSIS — E782 Mixed hyperlipidemia: Secondary | ICD-10-CM | POA: Diagnosis not present

## 2016-12-24 NOTE — Progress Notes (Signed)
Cardiology Office Note  Date: 12/24/2016   ID: Eddie Cook, DOB 12/19/1970, MRN [id number]  PCP: Fredirick MaudlinHAWKINS,EDWARD L, MD  Primary Cardiologist: Nona DellSamuel Liron Eissler, MD   Chief Complaint  Patient presents with  . Exertional fatigue    History of Present Illness: Eddie Cook is a 46 y.o. male referred for cardiology consultation by Dr. Juanetta GoslingHawkins. He is [employer] [place], works full time, describes a lot of stress in general. Prior to Christmas 2017 he had been exercising regularly, running up to 4-5 miles at a time 3 days a week and also exercising with weights on the other days. Since that time he states that he has "gotten lazy" and has not been exercising. He describes relative fatigue with exertion, has to take a nap in the afternoons. No palpitations or shortness of breath at rest, no dizziness or syncope.  Record review finds evaluation by our practice back in 2012, patient saw Ms. Geni BersLenze PA-C at that time. No specific cardiac ischemic or structural testing was undertaken at that point. He states that he did undergo stress testing approximately 8 or 9 years ago at another facility.  I personally reviewed his ECG today which shows sinus rhythm with nonspecific IVCD. He has a history of incomplete right bundle branch block. No cardiac arrhythmias.  Past Medical History:  Diagnosis Date  . ADHD (attention deficit hyperactivity disorder)   . Anxiety   . Depression   . Essential hypertension   . GERD (gastroesophageal reflux disease)   . Hyperlipidemia   . Incomplete RBBB   . Low testosterone     Past Surgical History:  Procedure Laterality Date  . BACK SURGERY  2015  . COLONOSCOPY  10/2009   [facility]. Propofol. Negative. Next TCS 10/2019.  . ESOPHAGOGASTRODUODENOSCOPY  2010   foreign body per patient  . ESOPHAGOGASTRODUODENOSCOPY (EGD) WITH ESOPHAGEAL DILATION  09/30/2012   RMR: Schatzki's ring s/p dilation with 56-F, erosive and ulcerative reflux esophagitis, antral  erosions,  path negative for H.pylori, esophageal biopsy with mild inflammation c/w GERD, no Barretts    Current Outpatient Prescriptions  Medication Sig Dispense Refill  . ALPRAZolam (XANAX) 0.5 MG tablet Take 0.5 mg by mouth daily.   0  . amLODipine (NORVASC) 5 MG tablet Take 5 mg by mouth daily.    . calcium-vitamin D (OSCAL WITH D) 500-200 MG-UNIT per tablet Take 1 tablet by mouth.    . DEXILANT 60 MG capsule take 1 capsule by mouth once daily 30 capsule 11  . diclofenac (VOLTAREN) 75 MG EC tablet Take 75 mg by mouth 2 (two) times daily.  0  . pravastatin (PRAVACHOL) 40 MG tablet Take 40 mg by mouth daily.      Marland Kitchen. telmisartan-hydrochlorothiazide (MICARDIS HCT) 80-12.5 MG tablet Take 1 tablet by mouth daily.    . Testosterone (ANDROGEL) 25 MG/2.5GM GEL Place onto the skin as directed.       No current facility-administered medications for this visit.    Allergies:  Penicillins   Social History: The patient  reports that he has never smoked. He has never used smokeless tobacco. He reports that he drinks about 2.0 oz of alcohol per week . He reports that he does not use drugs.   Family History: The patient's family history includes Clotting disorder (age of onset: 6277) in his father; Dementia in his mother; Heart attack in his maternal grandfather; Heart disease in his maternal grandmother; Hypertension in his brother and mother.   ROS:  Please see the  history of present illness. Otherwise, complete review of systems is positive for none.  All other systems are reviewed and negative.   Physical Exam: VS:  BP 116/70 (BP Location: Left Arm)   Pulse 67   Ht 6' (1.829 m)   Wt 213 lb (96.6 kg)   SpO2 98%   BMI 28.89 kg/m , BMI Body mass index is 28.89 kg/m.  Wt Readings from Last 3 Encounters:  12/24/16 213 lb (96.6 kg)  11/04/14 219 lb 6.4 oz (99.5 kg)  10/21/14 210 lb (95.3 kg)    General: Patient appears comfortable at rest. HEENT: Conjunctiva and lids normal, oropharynx  clear. Neck: Supple, no elevated JVP or carotid bruits, no thyromegaly. Lungs: Clear to auscultation, nonlabored breathing at rest. Cardiac: Regular rate and rhythm, no S3 or significant systolic murmur, no pericardial rub. Abdomen: Soft, nontender, bowel sounds present, no guarding or rebound. Extremities: No pitting edema, distal pulses 2+. Skin: Warm and dry. Musculoskeletal: No kyphosis. Neuropsychiatric: Alert and oriented x3, affect grossly appropriate.  ECG: I personally reviewed the tracing from 01/03/2011 which showed sinus rhythm with incomplete right bundle branch block.  Other Studies Reviewed Today:  Chest x-ray 01/20/2016: FINDINGS: The heart size and mediastinal contours are within normal limits. Both lungs are clear. The visualized skeletal structures are unremarkable.  IMPRESSION: No active cardiopulmonary disease.  Assessment and Plan:  1. Exertional fatigue with nonspecific abnormal ECG at baseline. Cardiac risk factors include age and gender, also hypertension and hyperlipidemia. He has not undergone any objective ischemic testing in the last 8 years. We will proceed with an exercise echocardiogram.  2. Essential hypertension, blood pressure is well controlled today on Norvasc and Micardis.  3. Mixed hyperlipidemia, tolerating Pravachol was follow-up per Dr. Juanetta Gosling.  4. History of low testosterone, on AndroGel. Could also contribute to fatigue.  Current medicines were reviewed with the patient today.   Orders Placed This Encounter  Procedures  . EKG 12-Lead  . ECHOCARDIOGRAM STRESS TEST    Disposition: Call with test results.  Signed, Jonelle Sidle, MD, [facility] 12/24/2016 1:45 PM    [facility] at [facility] [address] [place], [place] [address] Phone: [phone]; Fax: [phone]

## 2016-12-24 NOTE — Patient Instructions (Signed)
Your physician recommends that you schedule a follow-up appointment in: to be determined after test  Your physician has requested that you have a stress echocardiogram. For further information please visit [email or web address]. Please follow instruction sheet as given.   Your physician recommends that you continue on your current medications as directed. Please refer to the Current Medication list given to you today.      Thank you for choosing [facility] !         

## 2017-01-02 ENCOUNTER — Ambulatory Visit (HOSPITAL_COMMUNITY)
Admission: RE | Admit: 2017-01-02 | Discharge: 2017-01-02 | Disposition: A | Discharge status: Home / Self Care | Payer: BLUE CROSS/BLUE SHIELD | Source: Ambulatory Visit | Attending: Cardiology | Admitting: Cardiology

## 2017-01-02 DIAGNOSIS — R5383 Other fatigue: Secondary | ICD-10-CM | POA: Insufficient documentation

## 2017-01-02 DIAGNOSIS — R9431 Abnormal electrocardiogram [ECG] [EKG]: Secondary | ICD-10-CM | POA: Diagnosis not present

## 2017-01-02 LAB — ECHOCARDIOGRAM STRESS TEST
CSEPEW: 17.2 METS
CSEPHR: 103 %
CSEPPHR: 179 {beats}/min
Exercise duration (min): 13 min
Exercise duration (sec): 6 s
MPHR: 173 {beats}/min
RPE: 11
Rest HR: 84 {beats}/min

## 2017-01-02 NOTE — Progress Notes (Signed)
*  PRELIMINARY RESULTS* Echocardiogram Echocardiogram Stress Test has been performed.  Stacey DrainWhite, Daeveon Zweber J 01/02/2017, 10:15 AM

## 2017-10-17 ENCOUNTER — Ambulatory Visit (INDEPENDENT_AMBULATORY_CARE_PROVIDER_SITE_OTHER): Payer: Commercial Managed Care - PPO | Admitting: Otolaryngology

## 2017-10-17 DIAGNOSIS — K219 Gastro-esophageal reflux disease without esophagitis: Secondary | ICD-10-CM

## 2017-10-17 DIAGNOSIS — R490 Dysphonia: Secondary | ICD-10-CM | POA: Diagnosis not present

## 2017-10-17 DIAGNOSIS — R070 Pain in throat: Secondary | ICD-10-CM

## 2017-11-04 ENCOUNTER — Other Ambulatory Visit: Payer: Self-pay | Admitting: *Deleted

## 2017-11-06 MED ORDER — DEXLANSOPRAZOLE 60 MG PO CPDR: 1.0000 | DELAYED_RELEASE_CAPSULE | Freq: Every day | ORAL | 2 refills | Status: AC

## 2017-11-06 NOTE — Telephone Encounter (Signed)
Please tell the patient we havent seen him in about 3 years. I can send in a refill to last home about 2-3 months but he will need an OV for further refills.

## 2017-11-18 ENCOUNTER — Ambulatory Visit (INDEPENDENT_AMBULATORY_CARE_PROVIDER_SITE_OTHER): Payer: Commercial Managed Care - PPO | Admitting: Otolaryngology

## 2017-11-18 DIAGNOSIS — R490 Dysphonia: Secondary | ICD-10-CM

## 2017-11-18 DIAGNOSIS — R070 Pain in throat: Secondary | ICD-10-CM | POA: Diagnosis not present

## 2018-03-20 ENCOUNTER — Encounter (HOSPITAL_COMMUNITY): Payer: Self-pay | Admitting: Emergency Medicine

## 2018-03-20 ENCOUNTER — Emergency Department (HOSPITAL_COMMUNITY)
Admission: EM | Admit: 2018-03-20 | Discharge: 2018-03-21 | Disposition: A | Discharge status: Home / Self Care | Payer: Commercial Managed Care - PPO | Attending: Emergency Medicine | Admitting: Emergency Medicine

## 2018-03-20 DIAGNOSIS — E119 Type 2 diabetes mellitus without complications: Secondary | ICD-10-CM | POA: Insufficient documentation

## 2018-03-20 DIAGNOSIS — K1379 Other lesions of oral mucosa: Secondary | ICD-10-CM

## 2018-03-20 DIAGNOSIS — K91840 Postprocedural hemorrhage and hematoma of a digestive system organ or structure following a digestive system procedure: Secondary | ICD-10-CM | POA: Insufficient documentation

## 2018-03-20 DIAGNOSIS — I10 Essential (primary) hypertension: Secondary | ICD-10-CM | POA: Diagnosis not present

## 2018-03-20 DIAGNOSIS — Z79899 Other long term (current) drug therapy: Secondary | ICD-10-CM | POA: Insufficient documentation

## 2018-03-20 NOTE — ED Triage Notes (Signed)
Pt states he had gum grafting today. PT has uncontrollable bleeding from the roof of the mouth that started around 2030.

## 2018-03-21 MED ORDER — SILVER NITRATE-POT NITRATE 75-25 % EX MISC
CUTANEOUS | Status: AC
  Administered 2018-03-21: 01:00:00
  Filled 2018-03-21: qty 2

## 2018-03-21 MED ORDER — LIDOCAINE-EPINEPHRINE (PF) 2 %-1:200000 IJ SOLN
INTRAMUSCULAR | Status: AC
  Administered 2018-03-21: 01:00:00
  Filled 2018-03-21: qty 20

## 2018-03-21 NOTE — Discharge Instructions (Addendum)
You were seen today for bleeding of the refill your mouth.  You had a brisk bleeder.  An absorbable stitch was placed.  Avoid any abrasive foods.  Maintain a soft diet.  If you rebleed, attempt to swish and spit ice cold water.  It is important that you follow-up with your dental surgeon later today for recheck.

## 2018-03-21 NOTE — ED Notes (Signed)
Pt ambulatory to waiting room. Pt verbalized understanding of discharge instructions.   

## 2018-03-21 NOTE — ED Provider Notes (Signed)
[facility] EMERGENCY DEPARTMENT Provider Note   CSN: [id number] Arrival date & time: 03/20/18  2334     History   Chief Complaint Chief Complaint  Patient presents with  . Mouth bleeding    HPI Eddie Cook is a 47 y.o. male.  HPI  This is a 47 year old male with a history of hypertension, hyperlipidemia who presents with oral bleeding.  Patient reports that he had gum surgery and grafting yesterday.  He reports having graft taken from the roof of his mouth and placed along his lower gumline.  He developed brisk bleeding and clots at approximately 8 PM.  He states "I have blood all over the place."  He denies any new injury or abrasion to the mouth.  He does report some pain along the lower gumline but no significant worsening pain over the upper gumline.  He is not on any blood thinners.  Reports he is due to see his oral surgeon/periodontist later today.  Past Medical History:  Diagnosis Date  . ADHD (attention deficit hyperactivity disorder)   . Anxiety   . Depression   . Essential hypertension   . GERD (gastroesophageal reflux disease)   . Hyperlipidemia   . Incomplete RBBB   . Low testosterone     Patient Active Problem List   Diagnosis Date Noted  . Bloating 11/04/2014  . Bilateral lower abdominal pain 02/23/2013  . Diarrhea 02/23/2013  . H/O colonoscopy 11/03/2012  . Abdominal discomfort 10/10/2012  . Epigastric pain 09/29/2012  . Vomiting 09/29/2012  . Esophageal dysphagia 09/29/2012  . Chest pain 01/03/2011  . Hypertension 01/03/2011  . Hyperlipidemia 01/03/2011  . HYPERLIPIDEMIA-MIXED 01/03/2011  . ANXIETY STATE, UNSPECIFIED 01/03/2011  . HYPERTENSION, BENIGN 01/03/2011  . PALPITATIONS 01/03/2011  . CHEST PAIN-UNSPECIFIED 01/03/2011    Past Surgical History:  Procedure Laterality Date  . BACK SURGERY  2015  . COLONOSCOPY  10/2009   [facility]. Propofol. Negative. Next TCS 10/2019.  . ESOPHAGOGASTRODUODENOSCOPY  2010   foreign body per patient    . ESOPHAGOGASTRODUODENOSCOPY (EGD) WITH ESOPHAGEAL DILATION  09/30/2012   RMR: Schatzki's ring s/p dilation with 56-F, erosive and ulcerative reflux esophagitis, antral erosions,  path negative for H.pylori, esophageal biopsy with mild inflammation c/w GERD, no Barretts        Home Medications    Prior to Admission medications   Medication Sig Start Date End Date Taking? Authorizing Provider  ALPRAZolam Prudy Feeler) 0.5 MG tablet Take 0.5 mg by mouth daily.  11/03/16   [provider]  amLODipine (NORVASC) 5 MG tablet Take 5 mg by mouth daily.    [provider]  calcium-vitamin D (OSCAL WITH D) 500-200 MG-UNIT per tablet Take 1 tablet by mouth.    [provider]  dexlansoprazole (DEXILANT) 60 MG capsule Take 1 capsule (60 mg total) by mouth daily. 11/06/17   Anice Paganini, NP  diclofenac (VOLTAREN) 75 MG EC tablet Take 75 mg by mouth 2 (two) times daily. 12/03/16   [provider]  pravastatin (PRAVACHOL) 40 MG tablet Take 40 mg by mouth daily.      [provider]  telmisartan-hydrochlorothiazide (MICARDIS HCT) 80-12.5 MG tablet Take 1 tablet by mouth daily.    [provider]  Testosterone (ANDROGEL) 25 MG/2.5GM GEL Place onto the skin as directed.      [provider]    Family History Family History  Problem Relation Age of Onset  . Hypertension Mother   . Dementia Mother  Deceased, adenocarcinoma lung  . Hypertension Brother   . Heart disease Maternal Grandmother   . Heart attack Maternal Grandfather   . Clotting disorder Father 18       Blood clot in leg  . Liver disease Neg Hx   . Colon cancer Neg Hx     Social History Social History   Tobacco Use  . Smoking status: Never Smoker  . Smokeless tobacco: Never Used  . Tobacco comment: Occasionally smokes tobaccoless cigarettes or cigars when golfing, but never tobacco  Substance Use Topics  . Alcohol use: Yes    Alcohol/week: 2.0 oz    Types: 4 Standard  drinks or equivalent per week    Comment: Socially, couple of beers on the weekend  . Drug use: No     Allergies   Penicillins   Review of Systems Review of Systems  Constitutional: Negative for fever.  HENT: Negative for facial swelling and mouth sores.        Oral bleeding  All other systems reviewed and are negative.    Physical Exam Updated Vital Signs BP 138/81 (BP Location: Right Arm)   Pulse 76   Resp 18   Wt 89.4 kg (197 lb)   SpO2 94%   BMI 26.72 kg/m   Physical Exam  Constitutional: He is oriented to person, place, and time. He appears well-developed and well-nourished.  ABCs intact  HENT:  Head: Normocephalic and atraumatic.  Mouth/Throat:    Sutures noted along the hard palate laterally just adjacent and behind the teeth, brisk bleeding noted from the most anterior suture on the left side Grafts noted along the lower gumline with sutures intact  Eyes: Pupils are equal, round, and reactive to light.  Neck: Neck supple.  Cardiovascular: Normal rate, regular rhythm and normal heart sounds.  Pulmonary/Chest: Effort normal. No respiratory distress.  Musculoskeletal: He exhibits no edema.  Lymphadenopathy:    He has no cervical adenopathy.  Neurological: He is alert and oriented to person, place, and time.  Skin: Skin is warm and dry.  Psychiatric: He has a normal mood and affect.  Nursing note and vitals reviewed.    ED Treatments / Results  Labs (all labs ordered are listed, but only abnormal results are displayed) Labs Reviewed - No data to display  EKG None  Radiology No results found.  Procedures Wound repair Date/Time: 03/21/2018 12:57 AM Performed by: Shon Baton, MD Authorized by: Shon Baton, MD  Consent: Verbal consent obtained. Risks and benefits: risks, benefits and alternatives were discussed Time out: Immediately prior to procedure a "time out" was called to verify the correct patient, procedure, equipment,  support staff and site/side marked as required. Local anesthesia used: yes Anesthesia: local infiltration  Anesthesia: Local anesthesia used: yes Local Anesthetic: lidocaine 2% with epinephrine Anesthetic total: 3 mL  Sedation: Patient sedated: no  Patient tolerance: Patient tolerated the procedure well with no immediate complications Comments: 2% lidocaine with epinephrine was injected at site of bleeder, this slowed the bleeding down.  One figure-of-eight stitch with 6-0 rapid Vicryl was placed.  No recurrent bleeding noted.    (including critical care time)  Medications Ordered in ED Medications  lidocaine-EPINEPHrine (XYLOCAINE W/EPI) 2 %-1:200000 (PF) injection (  Given by Other 03/21/18 0037)  silver nitrate applicators 75-25 % applicator (  Given by Other 03/21/18 0037)     Initial Impression / Assessment and Plan / ED Course  I have reviewed the triage vital signs and the nursing notes.  Pertinent labs & imaging results that were available during my care of the patient were reviewed by me and considered in my medical decision making (see chart for details).     Patient presents with bleeding from his graft site of the upper mouth.  It is brisk.  Lidocaine with epinephrine was injected and slid to bleed.  One figure-of-eight stitch was placed with hemostasis of the wound.  Patient was observed for short period of time without rebleed.  Recommend close follow-up with oral surgeon.  After history, exam, and medical workup I feel the patient has been appropriately medically screened and is safe for discharge home. Pertinent diagnoses were discussed with the patient. Patient was given return precautions.   Final Clinical Impressions(s) / ED Diagnoses   Final diagnoses:  Bleeding from mouth    ED Discharge Orders    None       Horton, Mayer Masker, MD 03/21/18 [phone]

## 2019-08-31 ENCOUNTER — Telehealth: Payer: Self-pay | Admitting: Family Medicine

## 2019-08-31 NOTE — Telephone Encounter (Signed)
I'm sorry, right now we are over capacity as far as number of patients and not accepting new patients from even established families

## 2019-08-31 NOTE — Telephone Encounter (Signed)
Pt's son is a pt of Dr. Mariane Duval and would like to know if he can become a patient.

## 2021-12-12 ENCOUNTER — Other Ambulatory Visit: Payer: Self-pay

## 2021-12-12 ENCOUNTER — Encounter: Payer: Self-pay | Admitting: Neurology

## 2021-12-12 DIAGNOSIS — R202 Paresthesia of skin: Secondary | ICD-10-CM

## 2022-01-11 ENCOUNTER — Ambulatory Visit (INDEPENDENT_AMBULATORY_CARE_PROVIDER_SITE_OTHER): Payer: Commercial Managed Care - PPO | Admitting: Neurology

## 2022-01-11 ENCOUNTER — Other Ambulatory Visit: Payer: Self-pay

## 2022-01-11 DIAGNOSIS — G5602 Carpal tunnel syndrome, left upper limb: Secondary | ICD-10-CM

## 2022-01-11 DIAGNOSIS — R202 Paresthesia of skin: Secondary | ICD-10-CM

## 2022-01-11 NOTE — Procedures (Signed)
[facility]  ?[address] ? [place], [place] [address] ?Tel: [phone] ?Fax:  [phone] ?Test Date:  01/11/2022 ? ?Patient: Eddie Cook DOB: 07/24/1971 Physician: Nita Sickle, DO  ?Sex: Male Height: 6' " Ref Phys: Teryl Lucy, MD  ?ID#: [id number]   Technician:   ? ?Patient Complaints: ?This is a 51 year old man referred for evaluation of left hand paresthesias. ? ?NCV & EMG Findings: ?Extensive electrodiagnostic testing of the left upper extremity shows:  ?Left median sensory response shows prolonged latency (4.3 ms) and reduced amplitude (7.5 ?V).  Left ulnar and radial motor responses are within normal limits. ?Left median motor response shows prolonged latency (4.1 ms).  Left ulnar motor responses within normal limits. ?There is no evidence of active or chronic motor axonal loss changes affecting any of the tested muscles.  Motor unit configuration and recruitment pattern is within normal limits. ? ?Impression: ?Left median neuropathy at or distal to the wrist, consistent with a clinical diagnosis of carpal tunnel syndrome.  Overall, these findings are moderate in degree electrically. ?There is no evidence of a left cervical radiculopathy. ? ? ?___________________________ ?Nita Sickle, DO ? ? ? ?Nerve Conduction Studies ?Anti Sensory Summary Table ? ? Stim Site NR Peak (ms) Norm Peak (ms) P-T Amp (?V) Norm P-T Amp  ?Left Median Anti Sensory (2nd Digit)  34?C  ?Wrist    4.3 <3.6 7.5 >15  ?Left Radial Anti Sensory (Base 1st Digit)  34?C  ?Wrist    2.2 <2.7 22.6 >14  ?Left Ulnar Anti Sensory (5th Digit)  34?C  ?Wrist    2.9 <3.1 24.6 >10  ? ?Motor Summary Table ? ? Stim Site NR Onset (ms) Norm Onset (ms) O-P Amp (mV) Norm O-P Amp Site1 Site2 Delta-0 (ms) Dist (cm) Vel (m/s) Norm Vel (m/s)  ?Left Median Motor (Abd Poll Brev)  34?C  ?Wrist    4.1 <4.0 9.5 >6 Elbow Wrist 5.6 30.0 54 >50  ?Elbow    9.7  9.3         ?Left Ulnar Motor (Abd Dig Minimi)  34?C  ?Wrist    2.6 <3.1 9.9 >7  B Elbow Wrist 4.1 23.0 56 >50  ?B Elbow    6.7  9.2  A Elbow B Elbow 1.6 10.0 62 >50  ?A Elbow    8.3  8.9         ? ?EMG ? ? Side Muscle Ins Act Fibs Psw Fasc Number Recrt Dur Dur. Amp Amp. Poly Poly. Comment  ?Left 1stDorInt Nml Nml Nml Nml Nml Nml Nml Nml Nml Nml Nml Nml N/A  ?Left Abd Poll Brev Nml Nml Nml Nml Nml Nml Nml Nml Nml Nml Nml Nml N/A  ?Left PronatorTeres Nml Nml Nml Nml Nml Nml Nml Nml Nml Nml Nml Nml N/A  ?Left Biceps Nml Nml Nml Nml Nml Nml Nml Nml Nml Nml Nml Nml N/A  ?Left Triceps Nml Nml Nml Nml Nml Nml Nml Nml Nml Nml Nml Nml N/A  ?Left Deltoid Nml Nml Nml Nml Nml Nml Nml Nml Nml Nml Nml Nml N/A  ? ? ? ? ?Waveforms: ?    ? ?   ? ? ?

## 2022-04-20 ENCOUNTER — Other Ambulatory Visit (HOSPITAL_COMMUNITY): Payer: Self-pay | Admitting: Internal Medicine

## 2022-04-20 ENCOUNTER — Other Ambulatory Visit: Payer: Self-pay | Admitting: Internal Medicine

## 2022-04-20 DIAGNOSIS — E782 Mixed hyperlipidemia: Secondary | ICD-10-CM

## 2022-06-05 ENCOUNTER — Ambulatory Visit (HOSPITAL_COMMUNITY): Payer: Commercial Managed Care - PPO

## 2022-07-05 ENCOUNTER — Ambulatory Visit (HOSPITAL_COMMUNITY)
Admission: RE | Admit: 2022-07-05 | Discharge: 2022-07-05 | Disposition: A | Discharge status: Home / Self Care | Payer: Commercial Managed Care - PPO | Source: Ambulatory Visit | Attending: Internal Medicine | Admitting: Internal Medicine

## 2022-07-05 DIAGNOSIS — E782 Mixed hyperlipidemia: Secondary | ICD-10-CM | POA: Insufficient documentation
